# Patient Record
Sex: Female | Born: 1965 | Race: White | Hispanic: No | Marital: Married | Smoking: Never smoker
Health system: Southern US, Community
[De-identification: ages and names within clinical notes are randomized; demographics above are authoritative.]

## PROBLEM LIST (undated history)

## (undated) DIAGNOSIS — K5900 Constipation, unspecified: Secondary | ICD-10-CM

## (undated) DIAGNOSIS — N870 Mild cervical dysplasia: Secondary | ICD-10-CM

## (undated) DIAGNOSIS — L409 Psoriasis, unspecified: Secondary | ICD-10-CM

## (undated) DIAGNOSIS — Z9109 Other allergy status, other than to drugs and biological substances: Secondary | ICD-10-CM

## (undated) DIAGNOSIS — J45909 Unspecified asthma, uncomplicated: Secondary | ICD-10-CM

## (undated) HISTORY — DX: Mild cervical dysplasia: N87.0

## (undated) HISTORY — PX: MANDIBLE FRACTURE SURGERY: SHX706

## (undated) HISTORY — DX: Constipation, unspecified: K59.00

## (undated) HISTORY — DX: Unspecified asthma, uncomplicated: J45.909

## (undated) HISTORY — PX: LAPAROSCOPY: SHX197

## (undated) HISTORY — DX: Psoriasis, unspecified: L40.9

## (undated) HISTORY — PX: TUBAL LIGATION: SHX77

## (undated) HISTORY — DX: Other allergy status, other than to drugs and biological substances: Z91.09

---

## 2001-08-10 ENCOUNTER — Encounter: Payer: Self-pay | Admitting: Emergency Medicine

## 2001-08-10 ENCOUNTER — Emergency Department (HOSPITAL_COMMUNITY): Admission: EM | Admit: 2001-08-10 | Discharge: 2001-08-10 | Payer: Self-pay | Admitting: Emergency Medicine

## 2002-05-30 ENCOUNTER — Other Ambulatory Visit: Admission: RE | Admit: 2002-05-30 | Discharge: 2002-05-30 | Payer: Self-pay | Admitting: Obstetrics and Gynecology

## 2002-08-16 ENCOUNTER — Inpatient Hospital Stay (HOSPITAL_COMMUNITY): Admission: AD | Admit: 2002-08-16 | Discharge: 2002-08-16 | Payer: Self-pay | Admitting: Obstetrics and Gynecology

## 2002-09-25 DIAGNOSIS — O321XX0 Maternal care for breech presentation, not applicable or unspecified: Secondary | ICD-10-CM

## 2002-10-25 ENCOUNTER — Inpatient Hospital Stay (HOSPITAL_COMMUNITY): Admission: AD | Admit: 2002-10-25 | Discharge: 2002-10-25 | Payer: Self-pay | Admitting: *Deleted

## 2002-10-25 ENCOUNTER — Encounter: Payer: Self-pay | Admitting: *Deleted

## 2002-10-26 ENCOUNTER — Inpatient Hospital Stay (HOSPITAL_COMMUNITY): Admission: AD | Admit: 2002-10-26 | Discharge: 2002-10-26 | Payer: Self-pay | Admitting: Obstetrics & Gynecology

## 2002-11-24 ENCOUNTER — Inpatient Hospital Stay (HOSPITAL_COMMUNITY): Admission: AD | Admit: 2002-11-24 | Discharge: 2002-11-24 | Payer: Self-pay | Admitting: Obstetrics and Gynecology

## 2002-12-02 ENCOUNTER — Inpatient Hospital Stay (HOSPITAL_COMMUNITY): Admission: AD | Admit: 2002-12-02 | Discharge: 2002-12-05 | Payer: Self-pay | Admitting: Obstetrics and Gynecology

## 2002-12-02 ENCOUNTER — Encounter (INDEPENDENT_AMBULATORY_CARE_PROVIDER_SITE_OTHER): Payer: Self-pay

## 2003-01-08 ENCOUNTER — Other Ambulatory Visit: Admission: RE | Admit: 2003-01-08 | Discharge: 2003-01-08 | Payer: Self-pay | Admitting: Obstetrics and Gynecology

## 2004-02-23 ENCOUNTER — Other Ambulatory Visit: Admission: RE | Admit: 2004-02-23 | Discharge: 2004-02-23 | Payer: Self-pay | Admitting: Obstetrics and Gynecology

## 2005-09-12 ENCOUNTER — Ambulatory Visit: Payer: Self-pay | Admitting: Family Medicine

## 2006-08-02 ENCOUNTER — Inpatient Hospital Stay (HOSPITAL_COMMUNITY): Admission: AD | Admit: 2006-08-02 | Discharge: 2006-09-01 | Payer: Self-pay | Admitting: Obstetrics and Gynecology

## 2006-08-09 ENCOUNTER — Ambulatory Visit: Payer: Self-pay | Admitting: Neonatology

## 2006-08-10 ENCOUNTER — Encounter: Payer: Self-pay | Admitting: Obstetrics and Gynecology

## 2006-08-20 ENCOUNTER — Encounter: Payer: Self-pay | Admitting: Obstetrics and Gynecology

## 2006-08-29 ENCOUNTER — Encounter (INDEPENDENT_AMBULATORY_CARE_PROVIDER_SITE_OTHER): Payer: Self-pay | Admitting: Specialist

## 2008-02-11 ENCOUNTER — Ambulatory Visit: Payer: Self-pay | Admitting: Family Medicine

## 2008-02-11 DIAGNOSIS — M542 Cervicalgia: Secondary | ICD-10-CM | POA: Insufficient documentation

## 2008-02-11 DIAGNOSIS — J45909 Unspecified asthma, uncomplicated: Secondary | ICD-10-CM

## 2008-02-12 ENCOUNTER — Encounter: Payer: Self-pay | Admitting: Family Medicine

## 2008-02-15 ENCOUNTER — Emergency Department: Payer: Self-pay | Admitting: Emergency Medicine

## 2009-03-23 ENCOUNTER — Ambulatory Visit: Payer: Self-pay | Admitting: Family Medicine

## 2009-03-23 DIAGNOSIS — R42 Dizziness and giddiness: Secondary | ICD-10-CM

## 2009-08-12 ENCOUNTER — Ambulatory Visit: Payer: Self-pay | Admitting: Family Medicine

## 2009-08-12 DIAGNOSIS — L0231 Cutaneous abscess of buttock: Secondary | ICD-10-CM

## 2009-08-12 DIAGNOSIS — L03317 Cellulitis of buttock: Secondary | ICD-10-CM

## 2009-08-13 ENCOUNTER — Ambulatory Visit: Payer: Self-pay | Admitting: Family Medicine

## 2009-08-13 ENCOUNTER — Encounter (INDEPENDENT_AMBULATORY_CARE_PROVIDER_SITE_OTHER): Payer: Self-pay | Admitting: Internal Medicine

## 2009-08-18 ENCOUNTER — Ambulatory Visit: Payer: Self-pay | Admitting: Family Medicine

## 2009-08-21 ENCOUNTER — Ambulatory Visit: Payer: Self-pay | Admitting: Family Medicine

## 2009-08-24 ENCOUNTER — Telehealth (INDEPENDENT_AMBULATORY_CARE_PROVIDER_SITE_OTHER): Payer: Self-pay | Admitting: Internal Medicine

## 2009-09-18 ENCOUNTER — Telehealth (INDEPENDENT_AMBULATORY_CARE_PROVIDER_SITE_OTHER): Payer: Self-pay | Admitting: Internal Medicine

## 2009-09-23 ENCOUNTER — Telehealth (INDEPENDENT_AMBULATORY_CARE_PROVIDER_SITE_OTHER): Payer: Self-pay | Admitting: Internal Medicine

## 2010-06-17 ENCOUNTER — Ambulatory Visit: Payer: Self-pay | Admitting: Family Medicine

## 2010-06-17 DIAGNOSIS — R4589 Other symptoms and signs involving emotional state: Secondary | ICD-10-CM | POA: Insufficient documentation

## 2010-06-17 DIAGNOSIS — IMO0002 Reserved for concepts with insufficient information to code with codable children: Secondary | ICD-10-CM | POA: Insufficient documentation
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
## 2011-01-16 ENCOUNTER — Ambulatory Visit (INDEPENDENT_AMBULATORY_CARE_PROVIDER_SITE_OTHER): Payer: BC Managed Care – PPO | Admitting: Family Medicine

## 2011-01-16 ENCOUNTER — Encounter: Payer: Self-pay | Admitting: Family Medicine

## 2011-01-16 VITALS — BP 120/84 | HR 72 | Temp 98.8°F | Ht 66.75 in | Wt 169.2 lb

## 2011-01-16 DIAGNOSIS — J020 Streptococcal pharyngitis: Secondary | ICD-10-CM

## 2011-01-16 DIAGNOSIS — J029 Acute pharyngitis, unspecified: Secondary | ICD-10-CM

## 2011-01-16 DIAGNOSIS — R21 Rash and other nonspecific skin eruption: Secondary | ICD-10-CM

## 2011-01-16 LAB — POCT RAPID STREP A (OFFICE): Rapid Strep A Screen: NEGATIVE

## 2011-01-16 MED ORDER — TRIAMCINOLONE ACETONIDE 0.1 % EX CREA: TOPICAL_CREAM | Freq: Two times a day (BID) | CUTANEOUS | Status: DC

## 2011-01-16 MED ORDER — AMOXICILLIN 875 MG PO TABS: 875.0000 mg | ORAL_TABLET | Freq: Two times a day (BID) | ORAL | Status: DC

## 2011-01-16 MED ORDER — IBUPROFEN 600 MG PO TABS: 600.0000 mg | ORAL_TABLET | Freq: Four times a day (QID) | ORAL | Status: DC | PRN

## 2011-01-16 NOTE — Assessment & Plan Note (Signed)
underdosed PCN.  Pt prefers convenience of bid dosing so change to amoxicillin 875 x 7 days (complete 10 day course). Analgesic script provided.  Supportive care as per instructions.

## 2011-01-16 NOTE — Progress Notes (Signed)
  Subjective:    Patient ID: [name], female    DOB: Aug 08, 1966, 45 y.o.   MRN: [id number]  HPI CC: rash, ST  1. Friday started feeling awful; ST, fever, fatigue - went to [facility], RST +, given PCN 500mg  bid x 10 days.  Feeling some better but still fatigued, R face pain, ST.  Also doing warm salt water gargles.  Last strep was years ago.  Tends to get recurrences.  Drinking plenty of fluids, rest.  Stopped ibuprofen because ran out  2. Rash - started 1 wk ago.  Started anterior L ankle.  Now seems to be spreading.  Assumed it was poison oak, very pruritic.  Using baking soda.  Has been outside in last few weeks, no new lotions, detergents, soaps, shampoos, creams.  No sick contacts with similar rash.  Has been scratching.  No fevers since Friday.  No coughing, abd pain, myalgias, arthralgias.  Review of Systems Per HPI    Objective:   Physical Exam  Vitals reviewed. Constitutional: She is oriented to person, place, and time. She appears well-developed and well-nourished. No distress.  HENT:  Head: Normocephalic and atraumatic.  Right Ear: External ear normal.  Left Ear: External ear normal.  Nose: No mucosal edema or rhinorrhea. Right sinus exhibits no maxillary sinus tenderness and no frontal sinus tenderness. Left sinus exhibits no maxillary sinus tenderness and no frontal sinus tenderness.  Mouth/Throat: Uvula is midline and mucous membranes are normal. Oropharyngeal exudate, posterior oropharyngeal edema and posterior oropharyngeal erythema present. No tonsillar abscesses.       Tonsils with exudates and swollen bilaterally  Eyes: Conjunctivae and EOM are normal. Pupils are equal, round, and reactive to light. No scleral icterus.  Neck: Normal range of motion. Neck supple. No JVD present. No thyromegaly present.  Cardiovascular: Normal rate, regular rhythm, normal heart sounds and intact distal pulses.   No murmur heard. Pulmonary/Chest: Effort normal and breath sounds  normal. No respiratory distress. She has no wheezes. She has no rales.  Abdominal: She exhibits no distension and no mass. There is no hepatosplenomegaly. There is no tenderness. There is no rebound and no guarding.  Lymphadenopathy:    She has no cervical adenopathy.  Neurological: She is alert and oriented to person, place, and time.  Skin: Skin is warm and dry. Rash noted.       L anterior ankle rash with lichenification/scaling. R lower abd with raised macular erythematous pruritic rash R posterior calf with linear erythematous macule R index finger with pruritic papule at DIP. Spares palms/soles, wrists, interdigital spaces No ticks noted  Psychiatric: She has a normal mood and affect.          Assessment & Plan:

## 2011-01-16 NOTE — Assessment & Plan Note (Addendum)
Don't think related to pharyngitis as started prior with exposure outside. ? Poison oak/ivy with scratch leading to lichenification reaction anterior ankle, not yet other areas of rash. Treat itch with antifungal, recommended pt use benadryl at night to help combat itch. If not improving, return for further eval.

## 2011-01-16 NOTE — Patient Instructions (Signed)
Stop penicillin Start amoxicillin 875mg  twice daily for 7 days. For skin rash - use triamcinolone cream twice daily for 2-3 weeks. Ibuprofen sent to pharmacy as well - take with food.  Push fluids and plenty of water. Suck on cold things like popsicles or warm things like herbal teas (whichever soothes the throat better). If not improving as expected, return to be seen.  Call us with questions.

## 2011-02-10 NOTE — Discharge Summary (Signed)
[name], [name]          ACCOUNT NO.:  [id number]   MEDICAL RECORD NO.:  [id number]          PATIENT TYPE:  INP   LOCATION:  9137                          FACILITY:  [facility]   PHYSICIAN:  Lenoard Aden, M.D.DATE OF BIRTH:  04-Jul-1966   DATE OF ADMISSION:  08/02/2006  DATE OF DISCHARGE:  09/01/2006                               DISCHARGE SUMMARY   Patient underwent admission for symptomatic placenta previa.   HOSPITAL COURSE:  Uncomplicated.   EXPECTANT MANAGEMENT:  Patient had a bleed which became management on  August 29, 2006, underwent repeat C. section and tubal ligation  without complications.  Discharged to home day #3.  Discharge teaching  done.  Prenatal vitamins. Ibuprofen given.  Follow-up in the office in 4-  6 weeks.      Lenoard Aden, M.D.  Electronically Signed     RJT/MEDQ  D:  10/05/2006  T:  10/05/2006  Job:  [id number]

## 2011-02-10 NOTE — Discharge Summary (Signed)
   NAME:  [name], [name]                      ACCOUNT NO.:  [id number]   MEDICAL RECORD NO.:  [id number]                   PATIENT TYPE:   LOCATION:                                       FACILITY:   PHYSICIAN:  Maxie Better, M.D.            DATE OF BIRTH:   DATE OF ADMISSION:  DATE OF DISCHARGE:                                 DISCHARGE SUMMARY   ADMISSION DIAGNOSES:  1. Breech presentation.  2. Spontaneous rupture of membranes.  3. Early labor.  4. Intrauterine gestation at 12 and 1/7th weeks.   DISCHARGE DIAGNOSES:  1. Intrauterine gestation at 28 and 1/7th weeks, delivered.  2. Homero Fellers breech presentation.  3. Early labor.  4. Spontaneous rupture of membranes.  5. Postoperative anemia.   PROCEDURE:  Primary Cesarean section.   HISTORY OF PRESENT ILLNESS:  The patient is a 45 year old Gravida III, Para  I, 0/1/1 female at 37 and 1/7th weeks gestation who presented with  spontaneous rupture of membranes with a breech presentation. She was  admitted due to the above findings and need for a Cesarean section.   HOSPITAL COURSE:  The patient was admitted. Rupture of membranes was  confirmed. She was taken to the OR where she underwent a primary Cesarean  section with resultant delivery of an 8 pound 10 ounce female infant with  Apgar's of 8 and 9. Normal tubes and ovaries. The patient had an  uncomplicated postoperative course. On postoperative day three the patient  was tolerating a regular diet. Incision showed no evidence of an infection.  She was deemed able to be discharged to home.   LABORATORY DATA:  Her CBC on postoperative day one showed a hemoglobin and  hematocrit of 9.3 and 26. White count of 9.5. Platelet count 166,000.   DISPOSITION:  To home.   CONDITION ON DISCHARGE:  Stable.   DISCHARGE MEDICATIONS:  1. Prenatal vitamins one by mouth each day.  2. Ferrous sulfate 325 mg one by mouth each day.  3. Tylox #30 one every six to eight hours by mouth as  needed pain.  4. Motrin 600 mg one every six hours as needed pain.    FOLLOW UP:  At four weeks at [facility]. Discharge instructions were  reviewed. Postpartum booklet given with instructions.                                               Maxie Better, M.D.    [place]/MEDQ  D:  12/26/2002  T:  12/27/2002  Job:  [id number]

## 2011-02-10 NOTE — Op Note (Signed)
[name], [name]          ACCOUNT NO.:  [id number]   MEDICAL RECORD NO.:  [id number]          PATIENT TYPE:  INP   LOCATION:                                [facility]ILITY[facility]  [facility]   PHYSICIAN:  Lenoard Aden, M.D.DATE OF BIRTH:  [birth year]   DATE OF PROCEDURE:  08/29/2006  DATE OF DISCHARGE:                               OPERATIVE REPORT   PREOPERATIVE DIAGNOSES:  1. Thirty-four-week intrauterine pregnancy.  2. Placenta previa with acute onset of bleeding.  3. Footling breech presentation.  4. Desire for elective sterilization.   POSTOPERATIVE DIAGNOSES:  1. Thirty-four-week intrauterine pregnancy.  2. Placenta previa with acute onset of bleeding.  3. Footling breech presentation.  4. Desire for elective sterilization.   PROCEDURE:  1. Repeat cesarean section.  2. Tubal ligation.   SURGEON:  Lenoard Aden, M.D.   ASSISTANT:  Chester Holstein. Earlene Plater, M.D.   ANESTHESIA:  Spinal.   ANESTHESIOLOGIST:  Quillian Quince, M.D.   ESTIMATED BLOOD LOSS:  1000 mL.   COMPLICATIONS:  None.   DRAINS:  Foley.   COUNTS:  Correct.   DISPOSITION:  Patient to Recovery in good condition.   FINDINGS:  Full-term living female in footling breech position, 5 pounds 7  ounces, Apgars 8 and 9.  Posterior placenta previa, no accreta.  Normal  tubes.  Normal ovaries.   SPECIMENS:  Tubal segments to Pathology.   BRIEF OPERATIVE NOTE:  After being apprised of the risks of anesthesia,  infection, bleeding, injury to abdominal organs with need for repair,  delayed versus immediate complications to include bowel and bladder  injury, failure of risk of tubal ligation of 5-10 per thousand, the  patient is brought to the operating room, where she is administered a  spinal anesthetic without complication, prepped and draped in the usual  sterile fashion, Foley catheter placed.  After achieving adequate  anesthesia and dilute Marcaine solution placed, Pfannenstiel skin  incision is made with a  scalpel and carried down to fascia, which is  nicked in the midline and opened transversely using Mayo scissors.  The  rectus muscle is dissected sharply in the midline and peritoneum entered  sharply and bladder blade placed.  Visceroperitoneum is scored sharply  off the lower uterine segment and Kerr hysterotomy incision made.  Delivery from a footling breech position in the usual maneuvers is made  and flexion of the fetal vertex after clear amniotomy of fluid is noted,  Apgars 8 and 9, Pediatrics in attendance.  Placenta is delivered from a  posterior placenta previa location, manually intact, 3-vessel cord  noted.  Uterus exteriorized, curetted using a dry lap pack and closed in  2 running imbricating layers of a 0 chromic suture.  Please note at the  time, cervix was 3-cm dilated.  Good hemostasis was achieved, bladder  flap inspected and found to be hemostatic.  Right tube is traced out to  the fimbriated end.  Ampullary-isthmic portion of the tube is  identified.  Vascular portion in the mesosalpinx is cauterized, creating  a window; 0 plain ties are placed distally and proximally, tubal segment  excised.  Tubal  lumens are visualized and cauterized.  The same  procedure is done on the right tube as on the left tube.  Tubal lumens  are visualized, good hemostasis accomplished.  Irrigation is  accomplished.  Inspection is reestablished.  Inspection of the incision  is established and multiple interrupted sutures are placed to obtain  good hemostasis, which is obtained, prior to moving to the fascia, which  is then closed with a 0 Vicryl in a continuous-running fashion, and skin  closed using staples.  The patient tolerates the procedure well and is  transferred to Recovery in good condition.      Lenoard Aden, M.D.  Electronically Signed     RJT/MEDQ  D:  08/29/2006  T:  08/29/2006  Job:  [id number]

## 2011-02-10 NOTE — Op Note (Signed)
NAME:  [name]                    ACCOUNT NO.:  [id number]   MEDICAL RECORD NO.:  [id number]                   PATIENT TYPE:  INP   LOCATION:  9105                                 [facility]ACILITY:  [facility]   PHYSICIAN:  Maxie Better, M.D.            DATE OF BIRTH:  10/03/1965   DATE OF PROCEDURE:  12/02/2002  DATE OF DISCHARGE:                                 OPERATIVE REPORT   PREOPERATIVE DIAGNOSIS:  1. Breech presentation.  2. Early labor.  3. Spontaneous rupture of membranes.  4. Intrauterine gestation at 36-1/7 weeks.   POSTOPERATIVE DIAGNOSES:  1. Frank breech.  2. Early labor.  3. Spontaneous rupture of membranes.  4. Intrauterine gestation at 36-1/7 weeks.   PROCEDURE:  Primary cesarean section, low transverse uterine incision.   SURGEON:  Maxie Better, M.D.   ANESTHESIA:  Spinal.   FINDINGS:  Live female, frank breech position lying on abdomen.  Normal tubes  and ovaries.  Weight 8 pounds 10 ounces, Apgars of 8 and 9.   INDICATIONS:  [age]-year-old gravida 3, para 1-0-1-1 female at 36-1/7  weeks' gestation with spontaneous rupture of membranes in early labor who  was found to be breech presentation and who is now being admitted for  primary cesarean section.  Risks and benefits of the procedure have been  explained to the patient and her husband.  Consent was signed, and the  patient was transferred to the operating room. Her group B status was  unknown.   DESCRIPTION OF PROCEDURE:  Under adequate spinal anesthesia, the patient was  placed in the supine position with a left lateral tilt.  She was sterilely  prepped and draped in the usual fashion with an indwelling Foley catheter  placed sterilely.  Then 0.25% Marcaine was injected along the planned  incision line.  A Pfannenstiel skin incision was then made, carried down to  the rectus fascia was then made, carried down to the rectus fascia using  Bovie cautery.  The rectus fascia was  incised in the midline and extended  transversely.  The rectus fascia was bluntly entered with cautery,  dissected off the rectus muscle in a superior and inferior fashion.  The  rectus muscle was split in the midline.  The parietal peritoneum was entered  bluntly and extended.  The vesicouterine peritoneum was then opened.  The  bladder was then bluntly dissected off of the lower uterine segment and  displaced inferiorly using a bladder retractor.  A curvilinear low  transverse uterine  incision was then carefully made and extended  bilaterally using bandage scissors.  Subsequent delivery of a live female  infant was accomplished from a breech frank position.  The baby was lying on  the abdomen.  He was delivered in the usual breech maneuver.  The cord was  clamped and cut, and the baby was bulb suctioned on the abdomen, and he was  transferred to the awaiting pediatricians who assigned Apgars of  8 and 9 at  one and five minutes.  The weight of the baby was 8 pounds 10 ounces.  Normal tubes and ovaries were identified.  No intracavitary lesions were  seen.  The uterine incision was closed in two layers.  The first layer was a  0 Monocryl running locked stitch.  The second layer was imbricated using 0  Monocryl.  On the right mid area of the uterine incision multiple figure-of-  eight sutures were then made for good hemostasis.  Small bleeding along the  inferior aspect of the uterus as well as the bladder resection were  cauterized.  With good hemostasis noted, the abdomen was copiously irrigated  and suctioned of debris.  The parietal peritoneum and the vesicouterine  peritoneum were now closed.  The undersurface of the rectus fascia was  inspected.  The rectus muscles were inspected.  Small bleeders were  cauterized.  The rectus fascia was closed with 0 Vicryl x2.  The  subcutaneous area was irrigated and suctioned of debris, and the skin was  approximated using Ethibond staples.   Specimens:  Placenta sent to  pathology.  Estimated blood loss was 800 mL.  Intraoperative fluid was 2300  mL crystalloid.  Urine output was 375 mL clear yellow urine.  Complications  were none.  Sponge, instrument counts x2 were correct.  The patient  tolerated the procedure well and was transferred to the recovery room in  stable condition.                                               Maxie Better, M.D.    /MEDQ  D:  12/02/2002  T:  12/02/2002  Job:  [id number]

## 2011-02-10 NOTE — Consult Note (Signed)
   NAME:  [name], [name]                    ACCOUNT NO.:  [id number]   MEDICAL RECORD NO.:  [id number]                   PATIENT TYPE:  MAT   LOCATION:  [facility]                                 [facility]ILITY:  [facility]   PHYSICIAN:  Lenoard Aden, M.D.             DATE OF BIRTH:  [birth year]   DATE OF CONSULTATION:  11/24/2002  DATE OF DISCHARGE:                                   CONSULTATION   CHIEF COMPLAINT:  Rule out preterm labor.   HISTORY OF PRESENT ILLNESS:  A 45 year old white female G3, P1, EDD December 29, 2002 at 35 weeks to rule out preterm labor.  History of contractions.  History of preterm cervical change.   ALLERGIES:  The patient has no known drug allergies.   MEDICATIONS:  Prenatal vitamins.   PAST MEDICAL HISTORY:  History of TAB in 1998, spontaneous vaginal delivery  in 1994.  History of laparoscopy for infertility.  No other medical or  surgical hospitalizations except for jaw surgery.   FAMILY HISTORY:  Thyroid disease, uterine cancer, stroke, cardiovascular  disease.   PRENATAL LABORATORY DATA:  Blood type O- status post RhoGAM.  Rh antibody  negative.  Rubella immune.  Hepatitis B surface negative.  HIV nonreactive.   Prenatal course complicated by preterm cervical change, stable on bed rest.   PHYSICAL EXAMINATION:  GENERAL:  She is a well-developed, well-nourished  white female in no apparent distress.  HEENT:  Normal.  LUNGS:  Clear.  HEART:  Regular rhythm.  ABDOMEN:  Soft, gravid, nontender.  Estimated fetal weight on ultrasound 6  pounds.  PELVIC:  Cervix is loose 2 cm, thick, breech, -2.  EXTREMITIES:  No cords.  NEUROLOGIC:  Nonfocal.   LABORATORIES:  Fetal heart rate reactive.  Contractions are irregular.   IMPRESSION:  1. A 35-week OB.  2. Breech malpresentation.  3. Preterm cervical change, stable.    PLAN:  Subcutaneous terbutaline x1.  Procardia x24 hours p.r.n.  contractions.  Risks, benefits discussed.  External cephalic version  discussed.  Follow up in the office in three days.  Work limitations  discussed.                                               Lenoard Aden, M.D.    RJT/MEDQ  D:  11/24/2002  T:  11/24/2002  Job:  [id number]

## 2011-02-10 NOTE — H&P (Signed)
NAMESALLYE, LUNZ          ACCOUNT NO.:  [id number]   MEDICAL RECORD NO.:  [id number]          PATIENT TYPE:  INP   LOCATION:  9152                          FACILITY:  [facility]   PHYSICIAN:  Lenoard Aden, M.D.DATE OF BIRTH:  1965-10-04   DATE OF ADMISSION:  08/02/2006  DATE OF DISCHARGE:                                HISTORY & PHYSICAL   CHIEF COMPLAINT:  Bleeding.   She is a 45 year old Caucasian female, G6, P1, 1-3-2, who presents at [redacted]  weeks gestation with a known posterior complete placenta previa and heavy  bleeding this morning, starting at 8:00 a.m.  She denies any cramping.  She  denies any leakage of fluids.  She reports good fetal movement.  She has no  known drug allergies.   She has a history of an abnormal Pap smear, history of surgical and  hospitalization for C-section in 2004, pyelonephritis in 1996.  She is a  nonsmoker and a nondrinker.  She denies domestic physical violence.   Medications include prenatal vitamins.   FAMILY HISTORY:  Diabetes, myocardial infarction, chronic hypertension,  uterine cancer, thyroid dysfunction, mental retardation, lymphoma, and  stroke.   Her pregnancy history is remarkable for two miscarriages, one abortion, all  first trimester in nature.  She had a first pregnancy which she has two  living children, one was 8 pounds, 9 ounces, vaginal delivery in 1994, and  one was an emergency C-section in 2004.   PHYSICAL EXAMINATION:  GENERAL:  She is a well-developed and well-nourished  white female in no apparent distress.  VITAL SIGNS:  Blood pressure 130/80.  HEENT:  Normal.  LUNGS:  Clear.  HEART:  Regular rhythm.  ABDOMEN:  Soft, gravid, and nontender.  PELVIC:  Speculum exam reveals about 10 cc of clotted blood in the posterior  fornix.  Cervix is visually closed.  No active bleeding is noted.  EXTREMITIES:  No cords.  NEUROLOGIC:  Nonfocal.   Coagulation studies within normal limits.  Hemoglobin of 13, platelet  count  229,000.  NST is reactive with fetal heart tones in the 130-140 beats per  minute range.  Accelerations.  No contractions.  No decelerations.   IMPRESSION:  1. A 30-week intrauterine pregnancy.  2. Previous cesarean sections, for repeat.  3. Complete posterior placenta previa with bleeding.  4. Rh-negative, status post RhoGAM on July 10, 2006.   PLAN:  Admit.  Hold clot.  Fetal monitoring today.  Bedrest and bathroom  privileges.  __________ ultrasound.  Anticipate repeat C-section potentially  in conjunction with amniocentesis between 34-36 weeks.      Lenoard Aden, M.D.  Electronically Signed     RJT/MEDQ  D:  08/02/2006  T:  08/02/2006  Job:  [id number]

## 2011-12-19 ENCOUNTER — Ambulatory Visit (INDEPENDENT_AMBULATORY_CARE_PROVIDER_SITE_OTHER): Payer: BC Managed Care – PPO | Admitting: Family Medicine

## 2011-12-19 ENCOUNTER — Encounter: Payer: Self-pay | Admitting: Family Medicine

## 2011-12-19 VITALS — BP 132/74 | HR 74 | Temp 98.1°F | Wt 176.0 lb

## 2011-12-19 DIAGNOSIS — J029 Acute pharyngitis, unspecified: Secondary | ICD-10-CM

## 2011-12-19 DIAGNOSIS — J020 Streptococcal pharyngitis: Secondary | ICD-10-CM

## 2011-12-19 MED ORDER — AMOXICILLIN 875 MG PO TABS: 875.0000 mg | ORAL_TABLET | Freq: Two times a day (BID) | ORAL | Status: AC

## 2011-12-19 NOTE — Progress Notes (Signed)
duration of symptoms: 4 days Rhinorrhea: no congestion:yes ear pain:no sore throat: no Cough: no myalgias:yes other concerns:chills and fatigued, facial pain and pressure, eyes watering, no fevers known She's some better today, ie no fever and some less pain.   Not using nasal saline- doesn't tolerate that.   Using vicks.    Advice worker.  Mult sick exposures.  ROS: See HPI.  Otherwise negative.    Meds, vitals, and allergies reviewed.   GEN: nad, alert and oriented HEENT: mucous membranes moist, TM w/o erythema, nasal epithelium injected with clear rhinorrhea, OP with cobblestoning NECK: supple w/o LA, tender LA on L side CV: rrr. PULM: ctab, no inc wob EXT: no edema  RST positive.

## 2011-12-19 NOTE — Patient Instructions (Signed)
Start the antibiotics today and gt some rest.  Drink plenty of fluids and take ibuprofen as needed.

## 2011-12-20 NOTE — Assessment & Plan Note (Signed)
With LA and RST positive.  Start abx, supportive tx o/w and fu prn.  She agrees. Nontoxic.

## 2012-04-25 ENCOUNTER — Encounter: Payer: Self-pay | Admitting: Family Medicine

## 2012-04-25 ENCOUNTER — Ambulatory Visit (INDEPENDENT_AMBULATORY_CARE_PROVIDER_SITE_OTHER): Payer: BC Managed Care – PPO | Admitting: Family Medicine

## 2012-04-25 VITALS — BP 120/88 | HR 87 | Temp 98.4°F | Wt 179.0 lb

## 2012-04-25 DIAGNOSIS — H6120 Impacted cerumen, unspecified ear: Secondary | ICD-10-CM

## 2012-04-25 NOTE — Progress Notes (Signed)
R ear- difficult to hear.  For 1 week.  No L sided sx.  No FCNAVD.  R ear feels plugged but not painful.  She used OTC de-wax kit.  No rhinorrhea.   Meds, vitals, and allergies reviewed.   ROS: See HPI.  Otherwise, noncontributory.  nad ncat R cerumen impaction resolved with irrigation and recheck TM wnl.  L tm, nasal and OP exam wnl

## 2012-04-25 NOTE — Patient Instructions (Signed)
Take care!

## 2012-04-26 DIAGNOSIS — H6120 Impacted cerumen, unspecified ear: Secondary | ICD-10-CM | POA: Insufficient documentation

## 2012-04-26 NOTE — Assessment & Plan Note (Signed)
Resolved, feels better, hearing at baseline.  F/u prn.

## 2013-02-25 ENCOUNTER — Ambulatory Visit (INDEPENDENT_AMBULATORY_CARE_PROVIDER_SITE_OTHER): Payer: BC Managed Care – PPO | Admitting: Family Medicine

## 2013-02-25 ENCOUNTER — Encounter: Payer: Self-pay | Admitting: Family Medicine

## 2013-02-25 VITALS — BP 122/70 | HR 69 | Temp 97.6°F | Wt 168.8 lb

## 2013-02-25 DIAGNOSIS — N390 Urinary tract infection, site not specified: Secondary | ICD-10-CM

## 2013-02-25 DIAGNOSIS — R300 Dysuria: Secondary | ICD-10-CM

## 2013-02-25 LAB — POCT URINALYSIS DIPSTICK
Protein, UA: NEGATIVE
Urobilinogen, UA: NEGATIVE

## 2013-02-25 MED ORDER — CIPROFLOXACIN HCL 250 MG PO TABS: 250.0000 mg | ORAL_TABLET | Freq: Two times a day (BID) | ORAL | Status: DC

## 2013-02-25 NOTE — Patient Instructions (Addendum)
Drink plenty of water and start the antibiotics today.  We'll contact you with your lab report.  Take care.   

## 2013-02-25 NOTE — Progress Notes (Signed)
Dysuria:yes, pressure with and without urination duration of symptoms: since last night abdominal pain: yes, lower abd pain fevers:no back pain:no vomiting:no Menses started yesterday  Meds, vitals, and allergies reviewed.   ROS: See HPI.  Otherwise negative.    GEN: nad, alert and oriented HEENT: mucous membranes moist NECK: supple CV: rrr.  PULM: ctab, no inc wob ABD: soft, +bs, suprapubic area tender EXT: no edema SKIN: no acute rash BACK: no CVA pain

## 2013-02-26 DIAGNOSIS — N390 Urinary tract infection, site not specified: Secondary | ICD-10-CM | POA: Insufficient documentation

## 2013-02-26 NOTE — Assessment & Plan Note (Signed)
Nontoxic, cipro, ucx, fluids, f/u prn.  She agrees. D/w pt.

## 2013-02-27 ENCOUNTER — Telehealth: Payer: Self-pay

## 2013-02-27 NOTE — Telephone Encounter (Signed)
pt left v/m; Pt was seen 02/25/13 and given Cipro for UTI; pt woke up during the night with heart racing and numbness in fingers and toes and was not sure if possible reaction to Cipro. Pt did not take med this morning. 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 Today pt states she feels OK; no heart racing.Please advise.

## 2013-02-27 NOTE — Telephone Encounter (Signed)
This would be atypical for a med reaction.  Especially if she had had this happen prev, ie before the cipro, I would think it was unrelated and okay to continue the abx.  I would restart the abx tonight.  If she has another episode (even if she isn't on the medicine), then we need to recheck her.  Thanks.

## 2013-02-27 NOTE — Telephone Encounter (Signed)
Patient advised.

## 2013-02-28 LAB — URINE CULTURE

## 2013-10-01 ENCOUNTER — Ambulatory Visit: Payer: Self-pay | Admitting: Obstetrics and Gynecology

## 2013-12-15 ENCOUNTER — Ambulatory Visit (INDEPENDENT_AMBULATORY_CARE_PROVIDER_SITE_OTHER): Payer: BC Managed Care – PPO | Admitting: Family Medicine

## 2013-12-15 ENCOUNTER — Encounter: Payer: Self-pay | Admitting: Family Medicine

## 2013-12-15 VITALS — BP 126/82 | HR 82 | Temp 98.0°F | Wt 178.8 lb

## 2013-12-15 DIAGNOSIS — J45909 Unspecified asthma, uncomplicated: Secondary | ICD-10-CM

## 2013-12-15 MED ORDER — LORATADINE 10 MG PO TABS: 10.0000 mg | ORAL_TABLET | Freq: Every day | ORAL | Status: DC

## 2013-12-15 MED ORDER — ALBUTEROL SULFATE HFA 108 (90 BASE) MCG/ACT IN AERS: 1.0000 | INHALATION_SPRAY | Freq: Four times a day (QID) | RESPIRATORY_TRACT | Status: DC | PRN

## 2013-12-15 NOTE — Patient Instructions (Signed)
Check your air filters and try claritin 10mg  a day.  If you need the inhaler more than a few times a week, let me know. We'll likely need to add on another inhaler. Take care.

## 2013-12-15 NOTE — Progress Notes (Signed)
Pre visit review using our clinic review tool, if applicable. No additional management support is needed unless otherwise documented below in the visit note.  She has a dog exposure and allergy related to that.  Asthma wise she was doing well until the exposure.  Had used some SABA with relief, but some jitteriness.  Some wheeze, dec with SABA.  Using it a few times in the last few weeks.  She can get SOB w/o the medicine.  Some nighttime cough, dec with inhaler.  Not taking claritin or similar. Minimal rhinorrhea, some sneezing and itchy eyes.    Meds, vitals, and allergies reviewed.   ROS: See HPI.  Otherwise, noncontributory.  GEN: nad, alert and oriented HEENT: mucous membranes moist, tm wnl, nasal and OP exam wnl NECK: supple w/o LA CV: rrr.   PULM: ctab, no inc wob ABD: soft, +bs EXT: no edema SKIN: no acute rash

## 2013-12-16 NOTE — Assessment & Plan Note (Addendum)
With pet exposure.  Will check air filters.  Use SABA prn, add on claritin.  Call back as needed, she may need inhaled steroid.  D/w pt.  She agrees.  Nontoxic.  We could refer for allergy shots if needed.

## 2014-01-30 ENCOUNTER — Ambulatory Visit: Payer: BC Managed Care – PPO | Admitting: Family Medicine

## 2014-03-11 ENCOUNTER — Telehealth: Payer: Self-pay | Admitting: Family Medicine

## 2014-03-11 DIAGNOSIS — J45909 Unspecified asthma, uncomplicated: Secondary | ICD-10-CM

## 2014-03-11 NOTE — Telephone Encounter (Signed)
Patient notified as instructed by telephone. Patient aware that the referral coordinator will be in touch with her to get this scheduled.

## 2014-03-11 NOTE — Telephone Encounter (Signed)
Pt called and request referral to [facility]. PCP is Dr. Glori Bickers but pt explained she has seen more of you in the past about allergies. Please advise  Call back # [phone]

## 2014-03-11 NOTE — Telephone Encounter (Signed)
Ordered. Thanks

## 2014-06-11 ENCOUNTER — Encounter: Payer: Self-pay | Admitting: Family Medicine

## 2014-06-11 ENCOUNTER — Ambulatory Visit (INDEPENDENT_AMBULATORY_CARE_PROVIDER_SITE_OTHER): Payer: BC Managed Care – PPO | Admitting: Family Medicine

## 2014-06-11 VITALS — BP 118/80 | HR 70 | Temp 98.7°F | Wt 183.5 lb

## 2014-06-11 DIAGNOSIS — M542 Cervicalgia: Secondary | ICD-10-CM

## 2014-06-11 MED ORDER — CYCLOBENZAPRINE HCL 10 MG PO TABS: 5.0000 mg | ORAL_TABLET | Freq: Every evening | ORAL | Status: DC | PRN
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
## 2014-06-11 NOTE — Patient Instructions (Signed)
Ibuprofen 200/tab- take up to 3 tabs up to 3 times a day with food.   Flexeril at night if needed.   Ice and heat in the meantime. Massage may help.  Take care.  Glad to see you.

## 2014-06-12 DIAGNOSIS — M542 Cervicalgia: Secondary | ICD-10-CM | POA: Insufficient documentation
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
## 2014-12-07 ENCOUNTER — Telehealth: Payer: Self-pay | Admitting: Family Medicine

## 2014-12-07 ENCOUNTER — Encounter: Payer: Self-pay | Admitting: Internal Medicine

## 2014-12-07 ENCOUNTER — Ambulatory Visit (INDEPENDENT_AMBULATORY_CARE_PROVIDER_SITE_OTHER): Payer: BC Managed Care – PPO | Admitting: Internal Medicine

## 2014-12-07 VITALS — BP 116/84 | HR 80 | Temp 98.1°F | Wt 187.0 lb

## 2014-12-07 DIAGNOSIS — R0789 Other chest pain: Secondary | ICD-10-CM

## 2014-12-07 DIAGNOSIS — R0602 Shortness of breath: Secondary | ICD-10-CM

## 2014-12-07 NOTE — Telephone Encounter (Signed)
[facility] Patient Name: [name] DOB: October 15, 1965 Initial Comment caller is c/o shortness of breath and chest pain Nurse Assessment Nurse: Ronnald Ramp, RN, Miranda Date/Time (Eastern Time): 12/07/2014 8:23:21 AM Confirm and document reason for call. If symptomatic, describe symptoms. ---Caller states she is having pressure, heaviness, soreness in her chest. Also with trouble breathing. Symptoms started on Friday. Has the patient traveled out of the country within the last 30 days? ---Not Applicable Does the patient require triage? ---Yes Related visit to physician within the last 2 weeks? ---No Does the PT have any chronic conditions? (i.e. diabetes, asthma, etc.) ---Yes List chronic conditions. ---Asthma Did the patient indicate they were pregnant? ---No Guidelines Guideline Title Affirmed Question Affirmed Notes Chest Pain [1] Chest pain lasts > 5 minutes AND [2] described as crushing, pressure-like, or heavy Final Disposition User Call EMS 911 Now Jones, RN, Miranda Comments Caller states she already has an appt for 10 am. Triaged to call 911 but she refused because she already set up the appt and wants to wait.

## 2014-12-07 NOTE — Patient Instructions (Signed)

## 2014-12-07 NOTE — Telephone Encounter (Signed)
PLEASE NOTE: All timestamps contained within this report are represented as [place] Standard Time. CONFIDENTIALTY NOTICE: This fax transmission is intended only for the addressee. It contains information that is legally privileged, confidential or otherwise protected from use or disclosure. If you are not the intended recipient, you are strictly prohibited from reviewing, disclosing, copying using or disseminating any of this information or taking any action in reliance on or regarding this information. If you have received this fax in error, please notify us immediately by telephone so that we can arrange for its return to [place]. Phone: [phone], Toll-Free: [phone], Fax: [phone] Page: 1 of 2 Call Id: [id number] [facility] Patient Name: [facility] Gender: Female DOB: 12-07-1965 Age: 49 Y 97 M 25 D Return Phone Number: [phone] (Primary) Address: [address] [place] [address] Client  [facility] Client Client Site [place] - Day Physician [name] Contact Type Call Call Type Triage / Clinical Relationship To Patient Self Appointment Disposition EMR Patient Reports Appointment Already Scheduled Return Phone Number [phone] (Primary) Chief Complaint CHEST PAIN (>=21 years) - pain, pressure, heaviness or tightness Initial Comment caller is c/o shortness of breath and chest pain PreDisposition Call Doctor Info pasted into Epic Yes Nurse Assessment Nurse: Ronnald Ramp, RN, Miranda Date/Time (Eastern Time): 12/07/2014 8:23:21 AM Confirm and document reason for call. If symptomatic, describe symptoms. ---Caller states she is having pressure, heaviness, soreness in her chest. Also with trouble breathing. Symptoms started on Friday. Has the patient traveled out of the country within the last  30 days? ---Not Applicable Does the patient require triage? ---Yes Related visit to physician within the last 2 weeks? ---No Does the PT have any chronic conditions? (i.e. diabetes, asthma, etc.) ---Yes List chronic conditions. ---Asthma Did the patient indicate they were pregnant? ---No Guidelines Guideline Title Affirmed Question Affirmed Notes Nurse Date/Time (Eastern Time) Chest Pain [1] Chest pain lasts > 5 minutes AND [2] described as crushing, pressure-like, or heavy Ronnald Ramp, RN, Miranda 12/07/2014 8:25:35 AM Disp. Time Eilene Ghazi Time) Disposition Final User 12/07/2014 8:22:25 AM Send to Urgent Queue Byrd Hesselbach 12/07/2014 8:28:15 AM Call EMS 911 Now Yes Ronnald Ramp, RN, Miranda PLEASE NOTE: All timestamps contained within this report are represented as [place] Standard Time. CONFIDENTIALTY NOTICE: This fax transmission is intended only for the addressee. It contains information that is legally privileged, confidential or otherwise protected from use or disclosure. If you are not the intended recipient, you are strictly prohibited from reviewing, disclosing, copying using or disseminating any of this information or taking any action in reliance on or regarding this information. If you have received this fax in error, please notify us immediately by telephone so that we can arrange for its return to [place]. Phone: [phone], Toll-Free: [phone], Fax: [phone] Page: 2 of 2 Call Id: [id number] Caller Understands: Yes Disagree/Comply: Disagree Disagree/Comply Reason: Disagree with instructions Care Advice Given Per Guideline CALL EMS 911 NOW: Immediate medical attention is needed. You need to hang up and call 911 (or an ambulance). Psychologist, forensic Discretion: I'll call you back in a few minutes to be sure you were able to reach them.) CARE ADVICE given per Chest Pain (Adult) guideline. After Care Instructions Given Call Event Type User Date / Time Description Comments User: Leverne Humbles, RN Date/Time Eilene Ghazi Time): 12/07/2014 8:31:34 AM Caller states she already has an appt for 10 am. Triaged to call  911 but she refused because she already set up the appt and wants to wait.

## 2014-12-07 NOTE — Progress Notes (Signed)
Pre visit review using our clinic review tool, if applicable. No additional management support is needed unless otherwise documented below in the visit note. 

## 2014-12-07 NOTE — Progress Notes (Signed)
Subjective:    Patient ID: [name], female    DOB: 02/18/1966, 49 y.o.   MRN: [id number]  HPI  Pt presents to the clinic today with c/o chest pressure and shortness of breath. She reports this started 3 days ago. It is constant but seems worse at night. She denies runny nose, cough or reflux. She has not had any trauma to the chest wall. Nothing seems to make it worse. She does have a history of asthma. She has been using her albuterol inhaler with some relief. She reports she has had these symptoms before but the chest pressure is more severe than it has ever been.  Review of Systems      History reviewed. No pertinent past medical history.  Current Outpatient Prescriptions  Medication Sig Dispense Refill  . albuterol (PROVENTIL HFA;VENTOLIN HFA) 108 (90 BASE) MCG/ACT inhaler Inhale 1-2 puffs into the lungs every 6 (six) hours as needed for wheezing or shortness of breath. 1 Inhaler 2  . montelukast (SINGULAIR) 10 MG tablet Take 10 mg by mouth at bedtime.    . fluticasone (FLONASE) 50 MCG/ACT nasal spray   3   No current facility-administered medications for this visit.    Allergies  Allergen Reactions  . Sulfamethoxazole-Trimethoprim     REACTION: rash, no SOB.    History reviewed. No pertinent family history.  History   Social History  . Marital Status: Married    Spouse Name: N/A  . Number of Children: N/A  . Years of Education: N/A   Occupational History  . Not on file.   Social History Main Topics  . Smoking status: Never Smoker   . Smokeless tobacco: Never Used  . Alcohol Use: 0.0 oz/week    0 Standard drinks or equivalent per week     Comment: rarely  . Drug Use: No  . Sexual Activity: Not on file   Other Topics Concern  . Not on file   Social History Narrative     Constitutional: Denies fever, malaise, fatigue, headache or abrupt weight changes.  HEENT: Denies eye pain, eye redness, ear pain, ringing in the ears, wax buildup, runny  nose, nasal congestion, bloody nose, or sore throat. Respiratory: Pt reports shortness of breath. Denies difficulty breathing, cough or sputum production.   Cardiovascular: Pt reports chest pressure. Denies chest pain, chest tightness, palpitations or swelling in the hands or feet.  Gastrointestinal: Denies abdominal pain, bloating, constipation, diarrhea or blood in the stool.  Musculoskeletal: Denies decrease in range of motion, difficulty with gait, muscle pain or joint pain and swelling.  Skin: Denies redness, rashes, lesions or ulcercations.   No other specific complaints in a complete review of systems (except as listed in HPI above).  Objective:   Physical Exam   BP 116/84 mmHg  Pulse 80  Temp(Src) 98.1 F (36.7 C) (Oral)  Wt 187 lb (84.823 kg)  SpO2 97% Wt Readings from Last 3 Encounters:  12/07/14 187 lb (84.823 kg)  06/11/14 183 lb 8 oz (83.235 kg)  12/15/13 178 lb 12 oz (81.08 kg)    General: Appears her stated age, well developed, well nourished in NAD. Skin: Warm, dry and intact. No rashes, lesions or ulcerations noted. Cardiovascular: Normal rate and rhythm. S1,S2 noted.  No murmur, rubs or gallops noted. No JVD or BLE edema. No carotid bruits noted. Pulmonary/Chest: Normal effort and positive vesicular breath sounds. No respiratory distress. No wheezes, rales or ronchi noted.  Abdomen: Soft and nontender. Normal bowel sounds,  no bruits noted. No distention or masses noted.  Musculoskeletal: Pain reproduced with palpation over the sternum. No pain with palpation of the anterior chest wall. Neurological: Alert and oriented.      Assessment & Plan:   Chest pressure and shortness of breath:  Exam benign ECG: normal She declines chest xray today ? MSK in origin- try some Ibuprofen and stretching Continue albuterol inhaler as needed  Follow up with PCP if symptoms persist or worsen

## 2014-12-07 NOTE — Telephone Encounter (Signed)
[name] received call from team health about pt; spoke with Janet Silversmith NP and since almost 10 Am pt can come to office for evaluation. Spoke with pt and she is on her way to office now; pt said had problem with heaviness in mid chest since 12/04/14 and pt does have asthma and thought was related to asthma. Pt said she is doing good and in no distress.

## 2014-12-07 NOTE — Telephone Encounter (Signed)
Pt has appt today at 10 Am with Webb Silversmith NP.

## 2015-09-06 ENCOUNTER — Encounter: Payer: Self-pay | Admitting: Primary Care

## 2015-09-06 ENCOUNTER — Ambulatory Visit (INDEPENDENT_AMBULATORY_CARE_PROVIDER_SITE_OTHER): Payer: BC Managed Care – PPO | Admitting: Primary Care

## 2015-09-06 VITALS — BP 124/76 | HR 96 | Temp 97.6°F | Wt 192.4 lb

## 2015-09-06 DIAGNOSIS — R0989 Other specified symptoms and signs involving the circulatory and respiratory systems: Secondary | ICD-10-CM

## 2015-09-06 DIAGNOSIS — R059 Cough, unspecified: Secondary | ICD-10-CM

## 2015-09-06 DIAGNOSIS — R05 Cough: Secondary | ICD-10-CM | POA: Diagnosis not present

## 2015-09-06 MED ORDER — DM-GUAIFENESIN ER 30-600 MG PO TB12: 1.0000 | ORAL_TABLET | Freq: Two times a day (BID) | ORAL | Status: DC

## 2015-09-06 MED ORDER — AZITHROMYCIN 250 MG PO TABS: ORAL_TABLET | ORAL | Status: DC

## 2015-09-06 NOTE — Patient Instructions (Signed)
Fill the antibiotic in 3 days if no improvement in symptoms.  You may take Mucinex twice daily for chest congestion. Take this will a full glass of water.  Increase consumption of fluids and rest.  It was a pleasure meeting you!

## 2015-09-06 NOTE — Progress Notes (Signed)
Pre visit review using our clinic review tool, if applicable. No additional management support is needed unless otherwise documented below in the visit note. 

## 2015-09-06 NOTE — Progress Notes (Signed)
Subjective:    Patient ID: [name], female    DOB: May 21, 1966, 49 y.o.   MRN: [id number]  HPI  [name] is a 49 year old female, with a history of asthma, who presents today with a chief complaint of cough. She also reports symptoms of nasal congestion , sore throat, and headache. Her symptoms has been present since Tuesday last week, and her cough started last Thursday. She was able to cough up 1 chunk of green mucous yesterday. She's taken expired children mucinex and ibuprofen. She started to feel better Saturday, then felt worse again Sunday.   Review of Systems  Constitutional: Negative for fever and chills.  HENT: Positive for congestion, sore throat and voice change. Negative for ear pain and postnasal drip.   Respiratory: Positive for cough and shortness of breath.   Cardiovascular: Negative for chest pain.  Gastrointestinal: Negative for nausea.       No past medical history on file.  Social History   Social History  . Marital Status: Married    Spouse Name: N/A  . Number of Children: N/A  . Years of Education: N/A   Occupational History  . Not on file.   Social History Main Topics  . Smoking status: Never Smoker   . Smokeless tobacco: Never Used  . Alcohol Use: 0.0 oz/week    0 Standard drinks or equivalent per week     Comment: rarely  . Drug Use: No  . Sexual Activity: Not on file   Other Topics Concern  . Not on file   Social History Narrative    No past surgical history on file.  No family history on file.  Allergies  Allergen Reactions  . Sulfamethoxazole-Trimethoprim     REACTION: rash, no SOB.    Current Outpatient Prescriptions on File Prior to Visit  Medication Sig Dispense Refill  . albuterol (PROVENTIL HFA;VENTOLIN HFA) 108 (90 BASE) MCG/ACT inhaler Inhale 1-2 puffs into the lungs every 6 (six) hours as needed for wheezing or shortness of breath. 1 Inhaler 2  . fluticasone (FLONASE) 50 MCG/ACT nasal spray Place 2 sprays  into both nostrils as needed.   3  . montelukast (SINGULAIR) 10 MG tablet Take 10 mg by mouth at bedtime.     No current facility-administered medications on file prior to visit.    BP 124/76 mmHg  Pulse 96  Temp(Src) 97.6 F (36.4 C) (Oral)  Wt 192 lb 6.4 oz (87.272 kg)  SpO2 98%  LMP 08/24/2015    Objective:   Physical Exam  Constitutional: She appears well-nourished.  HENT:  Right Ear: Tympanic membrane and ear canal normal.  Left Ear: Tympanic membrane and ear canal normal.  Nose: Right sinus exhibits no maxillary sinus tenderness and no frontal sinus tenderness. Left sinus exhibits no maxillary sinus tenderness and no frontal sinus tenderness.  Mouth/Throat: No oropharyngeal exudate, posterior oropharyngeal edema or posterior oropharyngeal erythema.  Eyes: Conjunctivae are normal. Pupils are equal, round, and reactive to light.  Neck: Neck supple.  Cardiovascular: Normal rate and regular rhythm.   Pulmonary/Chest: Effort normal and breath sounds normal. She has no wheezes. She has no rales.  Lymphadenopathy:    She has no cervical adenopathy.  Skin: Skin is warm and dry.          Assessment & Plan:  URI:  Nasal congestion, sore throat, headache since Tuesday, cough since Thursday last week. 1 chunk of green sputum coughed yesterday.  Exam unremarkable. Lungs clear. Suspect viral  URI due to presentation and exam. Will treat for supportive measures and have her fill printed Zpak RX in 3 days if no improvement. Mucinex, fluids, rest. Return precautions provided.

## 2016-02-14 ENCOUNTER — Ambulatory Visit (INDEPENDENT_AMBULATORY_CARE_PROVIDER_SITE_OTHER): Payer: BC Managed Care – PPO | Admitting: Primary Care

## 2016-02-14 ENCOUNTER — Encounter: Payer: Self-pay | Admitting: Primary Care

## 2016-02-14 VITALS — BP 122/80 | HR 65 | Temp 97.9°F | Wt 189.0 lb

## 2016-02-14 DIAGNOSIS — K649 Unspecified hemorrhoids: Secondary | ICD-10-CM

## 2016-02-14 LAB — IFOBT (OCCULT BLOOD): IMMUNOLOGICAL FECAL OCCULT BLOOD TEST: NEGATIVE

## 2016-02-14 MED ORDER — HYDROCORTISONE ACETATE 25 MG RE SUPP: 25.0000 mg | Freq: Two times a day (BID) | RECTAL | Status: DC

## 2016-02-14 NOTE — Patient Instructions (Signed)
I sent a prescription for hemorrhoid medication to your pharmacy. Insert 1 suppository into the rectum twice daily for hemorrhoids.  Please notify myself or Dr. Damita Dunnings if no improvement, or if you notice an increase in hemorrhoid flares.  Ensure you are eating enough fiber and drinking enough water.  Continue Sitz Baths as needed.  It was a pleasure meeting you!  Hemorrhoids Hemorrhoids are swollen veins around the rectum or anus. There are two types of hemorrhoids:   Internal hemorrhoids. These occur in the veins just inside the rectum. They may poke through to the outside and become irritated and painful.  External hemorrhoids. These occur in the veins outside the anus and can be felt as a painful swelling or hard lump near the anus. CAUSES  Pregnancy.   Obesity.   Constipation or diarrhea.   Straining to have a bowel movement.   Sitting for long periods on the toilet.  Heavy lifting or other activity that caused you to strain.  Anal intercourse. SYMPTOMS   Pain.   Anal itching or irritation.   Rectal bleeding.   Fecal leakage.   Anal swelling.   One or more lumps around the anus.  DIAGNOSIS  Your caregiver may be able to diagnose hemorrhoids by visual examination. Other examinations or tests that may be performed include:   Examination of the rectal area with a gloved hand (digital rectal exam).   Examination of anal canal using a small tube (scope).   A blood test if you have lost a significant amount of blood.  A test to look inside the colon (sigmoidoscopy or colonoscopy). TREATMENT Most hemorrhoids can be treated at home. However, if symptoms do not seem to be getting better or if you have a lot of rectal bleeding, your caregiver may perform a procedure to help make the hemorrhoids get smaller or remove them completely. Possible treatments include:   Placing a rubber band at the base of the hemorrhoid to cut off the circulation (rubber band  ligation).   Injecting a chemical to shrink the hemorrhoid (sclerotherapy).   Using a tool to burn the hemorrhoid (infrared light therapy).   Surgically removing the hemorrhoid (hemorrhoidectomy).   Stapling the hemorrhoid to block blood flow to the tissue (hemorrhoid stapling).  HOME CARE INSTRUCTIONS   Eat foods with fiber, such as whole grains, beans, nuts, fruits, and vegetables. Ask your doctor about taking products with added fiber in them (fibersupplements).  Increase fluid intake. Drink enough water and fluids to keep your urine clear or pale yellow.   Exercise regularly.   Go to the bathroom when you have the urge to have a bowel movement. Do not wait.   Avoid straining to have bowel movements.   Keep the anal area dry and clean. Use wet toilet paper or moist towelettes after a bowel movement.   Medicated creams and suppositories may be used or applied as directed.   Only take over-the-counter or prescription medicines as directed by your caregiver.   Take warm sitz baths for 15-20 minutes, 3-4 times a day to ease pain and discomfort.   Place ice packs on the hemorrhoids if they are tender and swollen. Using ice packs between sitz baths may be helpful.   Put ice in a plastic bag.   Place a towel between your skin and the bag.   Leave the ice on for 15-20 minutes, 3-4 times a day.   Do not use a donut-shaped pillow or sit on the toilet for long  periods. This increases blood pooling and pain.  SEEK MEDICAL CARE IF:  You have increasing pain and swelling that is not controlled by treatment or medicine.  You have uncontrolled bleeding.  You have difficulty or you are unable to have a bowel movement.  You have pain or inflammation outside the area of the hemorrhoids. MAKE SURE YOU:  Understand these instructions.  Will watch your condition.  Will get help right away if you are not doing well or get worse.   This information is not intended  to replace advice given to you by your health care provider. Make sure you discuss any questions you have with your health care provider.   Document Released: 09/08/2000 Document Revised: 08/28/2012 Document Reviewed: 07/16/2012 Elsevier Interactive Patient Education [employer].

## 2016-02-14 NOTE — Progress Notes (Signed)
   Subjective:    Patient ID: [name], female    DOB: 07/17/1966, 50 y.o.   MRN: [id number]  HPI  [name] is a 50 year old female who presents today with a chief complaint of rectal pain. She has a history of hemorrhoids for the past 20 years. Her discomfort began this past weekend. She will typically do hot sitz baths and apply preporation H with temporary improvement, but this has not helped to reduce her symptoms recently. She will experience flares very sparingly. Denies constipation, rectal bleeding, nausea abdominal pain.  Review of Systems  Constitutional: Negative for fatigue.  Gastrointestinal: Positive for rectal pain. Negative for nausea, vomiting, abdominal pain, diarrhea and constipation.       No past medical history on file.   Social History   Social History  . Marital Status: Married    Spouse Name: N/A  . Number of Children: N/A  . Years of Education: N/A   Occupational History  . Not on file.   Social History Main Topics  . Smoking status: Never Smoker   . Smokeless tobacco: Never Used  . Alcohol Use: 0.0 oz/week    0 Standard drinks or equivalent per week     Comment: rarely  . Drug Use: No  . Sexual Activity: Not on file   Other Topics Concern  . Not on file   Social History Narrative    No past surgical history on file.  No family history on file.  Allergies  Allergen Reactions  . Sulfamethoxazole-Trimethoprim     REACTION: rash, no SOB.    Current Outpatient Prescriptions on File Prior to Visit  Medication Sig Dispense Refill  . albuterol (PROVENTIL HFA;VENTOLIN HFA) 108 (90 BASE) MCG/ACT inhaler Inhale 1-2 puffs into the lungs every 6 (six) hours as needed for wheezing or shortness of breath. 1 Inhaler 2  . fluticasone (FLONASE) 50 MCG/ACT nasal spray Place 2 sprays into both nostrils as needed.   3  . montelukast (SINGULAIR) 10 MG tablet Take 10 mg by mouth at bedtime.     No current facility-administered medications  on file prior to visit.    BP 122/80 mmHg  Pulse 65  Temp(Src) 97.9 F (36.6 C) (Oral)  Wt 189 lb (85.73 kg)  SpO2 99%  LMP 02/10/2016    Objective:   Physical Exam  Constitutional: She appears well-nourished.  Cardiovascular: Normal rate.   Pulmonary/Chest: Effort normal.  Abdominal: Soft. Normal appearance and bowel sounds are normal. There is no tenderness.  1/2 cm non thrombosed external hemorrhoid to rectum. Small internal hemorrhoid also palpated during exam. Hemoccult stool card negative.   Skin: Skin is warm and dry.          Assessment & Plan:  Hemorrhoid:  History of for years, occur infrequently. Rectal pain for 2-3 days, no improvement with sitz baths and OTC treatment. Exam today with both internal and external hemorrhoid. Hemoccult stool card negative. RX for [facility] suppositories sent to pharmacy. Discussed instructions for use. She is to notify myself or PCP if no improvement, rectal bleeding, fatigue.

## 2016-02-14 NOTE — Progress Notes (Signed)
Pre visit review using our clinic review tool, if applicable. No additional management support is needed unless otherwise documented below in the visit note. 

## 2016-10-11 ENCOUNTER — Encounter: Payer: Self-pay | Admitting: Obstetrics and Gynecology

## 2016-11-06 NOTE — Progress Notes (Deleted)
      TROS  Objective:     Assessment:     Plan:

## 2016-11-08 ENCOUNTER — Encounter: Payer: Self-pay | Admitting: Obstetrics and Gynecology

## 2016-11-08 ENCOUNTER — Ambulatory Visit (INDEPENDENT_AMBULATORY_CARE_PROVIDER_SITE_OTHER): Payer: BC Managed Care – PPO | Admitting: Obstetrics and Gynecology

## 2016-11-08 VITALS — BP 143/84 | HR 64 | Ht 67.0 in | Wt 197.6 lb

## 2016-11-08 DIAGNOSIS — Z9851 Tubal ligation status: Secondary | ICD-10-CM

## 2016-11-08 DIAGNOSIS — J45909 Unspecified asthma, uncomplicated: Secondary | ICD-10-CM

## 2016-11-08 DIAGNOSIS — N119 Chronic tubulo-interstitial nephritis, unspecified: Secondary | ICD-10-CM

## 2016-11-08 DIAGNOSIS — N889 Noninflammatory disorder of cervix uteri, unspecified: Secondary | ICD-10-CM

## 2016-11-08 DIAGNOSIS — Z01419 Encounter for gynecological examination (general) (routine) without abnormal findings: Secondary | ICD-10-CM | POA: Diagnosis not present

## 2016-11-08 DIAGNOSIS — Z1239 Encounter for other screening for malignant neoplasm of breast: Secondary | ICD-10-CM

## 2016-11-08 DIAGNOSIS — N946 Dysmenorrhea, unspecified: Secondary | ICD-10-CM

## 2016-11-08 DIAGNOSIS — Z98891 History of uterine scar from previous surgery: Secondary | ICD-10-CM | POA: Diagnosis not present

## 2016-11-08 DIAGNOSIS — R638 Other symptoms and signs concerning food and fluid intake: Secondary | ICD-10-CM

## 2016-11-08 DIAGNOSIS — Z1231 Encounter for screening mammogram for malignant neoplasm of breast: Secondary | ICD-10-CM

## 2016-11-08 NOTE — Addendum Note (Signed)
Addended by: Elouise Munroe on: 11/08/2016 04:45 PM   Modules accepted: Orders

## 2016-11-08 NOTE — Progress Notes (Signed)
ANNUAL PREVENTATIVE CARE GYN  ENCOUNTER NOTE  Subjective:       [name] is a 51 y.o. [phone] female here for a routine annual gynecologic exam.  Current complaints: 1.  Heavy periods  2. Painful periods  Menses have changed in recent years. She cannot wait for menopause because of the heaviness of her cycle as well as the pain.   Gynecologic History Patient's last menstrual period was 11/01/2016 (exact date). Contraception: tubal ligation Last Pap: 10/15/2013 unsat./neg. Results were: normal Last mammogram: 10/01/2013 birad 1Results were: normal  Obstetric History OB History  Gravida Para Term Preterm AB Living  6 3 3   3 3   SAB TAB Ectopic Multiple Live Births  2 1     3     # Outcome Date GA Lbr Len/2nd Weight Sex Delivery Anes PTL Lv  6 Term 2007   5 lb 11.2 oz (2.586 kg) M CS-LTranv   LIV     Complications: Placenta Previa  5 Term 2004   8 lb 1.6 oz (3.674 kg) M CS-LTranv   LIV     Complications: Breech delivery  4 Term 1994   8 lb 14.4 oz (4.037 kg) M Vag-Spont   LIV  3 TAB 1988          2 SAB           1 SAB               Past Medical History:  Diagnosis Date  . Constipation   . Environmental allergies     Past Surgical History:  Procedure Laterality Date  . CESAREAN SECTION    . LAPAROSCOPY    . MANDIBLE FRACTURE SURGERY    . TUBAL LIGATION      Current Outpatient Prescriptions on File Prior to Visit  Medication Sig Dispense Refill  . albuterol (PROVENTIL HFA;VENTOLIN HFA) 108 (90 BASE) MCG/ACT inhaler Inhale 1-2 puffs into the lungs every 6 (six) hours as needed for wheezing or shortness of breath. 1 Inhaler 2  . fluticasone (FLONASE) 50 MCG/ACT nasal spray Place 2 sprays into both nostrils as needed.   3  . hydrocortisone (ANUSOL-HC) 25 MG suppository Place 1 suppository (25 mg total) rectally 2 (two) times daily. 12 suppository 0  . montelukast (SINGULAIR) 10 MG tablet Take 10 mg by mouth at bedtime.     No current facility-administered  medications on file prior to visit.     Allergies  Allergen Reactions  . Sulfamethoxazole-Trimethoprim     REACTION: rash, no SOB.    Social History   Social History  . Marital status: Married    Spouse name: N/A  . Number of children: N/A  . Years of education: N/A   Occupational History  . Not on file.   Social History Main Topics  . Smoking status: Never Smoker  . Smokeless tobacco: Never Used  . Alcohol use 0.0 oz/week     Comment: rarely  . Drug use: No  . Sexual activity: Yes    Birth control/ protection: Surgical   Other Topics Concern  . Not on file   Social History Narrative  . No narrative on file    Family History  Problem Relation Age of Onset  . Heart disease Maternal Grandfather   . Breast cancer Neg Hx   . Ovarian cancer Neg Hx   . Colon cancer Neg Hx   . Diabetes Neg Hx     The following portions of the patient's history were  reviewed and updated as appropriate: allergies, current medications, past family history, past medical history, past social history, past surgical history and problem list.  Review of Systems ROS Review of Systems - General ROS: negative for - chills, fatigue, fever, hot flashes, night sweats, weight gain or weight loss Psychological ROS: negative for - anxiety, decreased libido, depression, mood swings, physical abuse or sexual abuse Ophthalmic ROS: negative for - blurry vision, eye pain or loss of vision ENT ROS: negative for - headaches, hearing change, visual changes or vocal changes Allergy and Immunology ROS: negative for - hives, itchy/watery eyes or seasonal allergies Hematological and Lymphatic ROS: negative for - bleeding problems, bruising, swollen lymph nodes or weight loss Endocrine ROS: negative for - galactorrhea, hair pattern changes, hot flashes, malaise/lethargy, mood swings, palpitations, polydipsia/polyuria, skin changes, temperature intolerance or unexpected weight changes Breast ROS: negative for - new  or changing breast lumps or nipple discharge Respiratory ROS: negative for - cough or shortness of breath Cardiovascular ROS: negative for - chest pain, irregular heartbeat, palpitations or shortness of breath Gastrointestinal ROS: no abdominal pain, change in bowel habits, or black or bloody stools Genito-Urinary ROS: no dysuria, trouble voiding, or hematuria. POSITIVE-dysmenorrhea, menorrhagia Musculoskeletal ROS: negative for - joint pain or joint stiffness Neurological ROS: negative for - bowel and bladder control changes Dermatological ROS: negative for rash and skin lesion changes   Objective:   BP (!) 143/84   Pulse 64   Ht 5\' 7"  (1.702 m)   Wt 197 lb 9.6 oz (89.6 kg)   LMP 11/01/2016 (Exact Date)   BMI 30.95 kg/m  CONSTITUTIONAL: Well-developed, well-nourished female in no acute distress.  PSYCHIATRIC: Normal mood and affect. Normal behavior. Normal judgment and thought content. [place]: Alert and oriented to person, place, and time. Normal muscle tone coordination. No cranial nerve deficit noted. HENT:  Normocephalic, atraumatic, External right and left ear normal. Oropharynx is clear and moist EYES: Conjunctivae and EOM are normal. Pupils are equal, round, and reactive to light. No scleral icterus.  NECK: Normal range of motion, supple, no masses.  Normal thyroid.  SKIN: Skin is warm and dry. No rash noted. Not diaphoretic. No erythema. No pallor. CARDIOVASCULAR: Normal heart rate noted, regular rhythm, no murmur. RESPIRATORY: Clear to auscultation bilaterally. Effort and breath sounds normal, no problems with respiration noted. BREASTS: Symmetric in size. No masses, skin changes, nipple drainage, or lymphadenopathy. ABDOMEN: Soft, normal bowel sounds, no distention noted.  No tenderness, rebound or guarding.  BLADDER: Normal PELVIC:  External Genitalia: Normal  BUS: Normal  Vagina: Normal; blood in vaginal vault, minimal  Cervix: Normal; powder burn implants at 11:00 on  cervix (biopsied)  Uterus: Normal; midplane, top normal size, mobile, nontender; nonpalpable nontender  [place]nexa: Normal  RV: External Exam NormaI, No Rectal Masses and Normal Sphincter tone  MUSCULOSKELETAL: Normal range of motion. No tenderness.  No cyanosis, clubbing, or edema.  2+ distal pulses. LYMPHATIC: No Axillary, Supraclavicular, or Inguinal Adenopathy.    Assessment:   Annual gynecologic examination 52 y.o. Contraception: tubal ligation bmi-30 Problem List Items Addressed This Visit    None    Visit Diagnoses    Well woman exam with routine gynecological exam    -  Primary   Screening for breast cancer        Lesion of cervix, 5 mm powder burn implant; considering patient's worsening dysmenorrhea and menorrhagia, cannot rule out endometriosis  Plan:  Pap: Pap Co Test Mammogram: Ordered Stool Guaiac Testing:  Not Indicated Labs: declined  Routine preventative health maintenance measures emphasized: Exercise/Diet/Weight control, Tobacco Warnings, Alcohol/Substance use risks and Safe Sex Cervical biopsy-11:00 Return to [place], CMA  Brayton Mars, MD  Note: This dictation was prepared with Dragon dictation along with smaller phrase technology. Any transcriptional errors that result from this process are unintentional.

## 2016-11-08 NOTE — Patient Instructions (Addendum)
1. Pap smear is done 2. Mammogram is ordered 3. Continue with healthy eating and exercise 4. Continue taking Tums for calcium supplementation 5. Recommend vitamin D 1000 units a day 6. Screening labs are ordered 7. Contraception-BTL 8. Return in 1 year   Cervical Biopsy, Care After Introduction Refer to this sheet in the next few weeks. These instructions provide you with information about caring for yourself after your procedure. Your health care provider may also give you more specific instructions. Your treatment has been planned according to current medical practices, but problems sometimes occur. Call your health care provider if you have any problems or questions after your procedure. What can I expect after the procedure? After the procedure, it is common to have:  Cramping or mild pain for a few days.  Slight bleeding from the vagina for a few days.  Dark-colored vaginal discharge for a few days. Follow these instructions at home:  Take over-the-counter and prescription medicines only as told by your health care provider.  Return to your normal activities as told by your health care provider. Ask your health care provider what activities are safe for you.  Use a sanitary napkin until bleeding and discharge stop.  Do not use tampons until your health care provider approves.  Do not douche until your health care provider approves.  Do not have sex until your health care provider approves.  Keep all follow-up visits as told by your health care provider. This is important. Contact a health care provider if:  You have a fever or chills.  You have bad-smelling vaginal discharge.  You have itching or irritation around the vagina.  You have lower abdominal pain. Get help right away if:  You develop heavy vaginal bleeding that soaks more than one sanitary pad an hour.  You faint.  You have very bad lower abdominal pain. This information is not intended to replace  advice given to you by your health care provider. Make sure you discuss any questions you have with your health care provider. Document Released: 06/02/2015 Document Revised: 02/17/2016 Document Reviewed: 01/27/2015  2017 [employer]    Health Maintenance, Female Introduction Adopting a healthy lifestyle and getting preventive care can go a long way to promote health and wellness. Talk with your health care provider about what schedule of regular examinations is right for you. This is a good chance for you to check in with your provider about disease prevention and staying healthy. In between checkups, there are plenty of things you can do on your own. Experts have done a lot of research about which lifestyle changes and preventive measures are most likely to keep you healthy. Ask your health care provider for more information. Weight and diet Eat a healthy diet  Be sure to include plenty of vegetables, fruits, low-fat dairy products, and lean protein.  Do not eat a lot of foods high in solid fats, added sugars, or salt.  Get regular exercise. This is one of the most important things you can do for your health.  Most adults should exercise for at least 150 minutes each week. The exercise should increase your heart rate and make you sweat (moderate-intensity exercise).  Most adults should also do strengthening exercises at least twice a week. This is in addition to the moderate-intensity exercise. Maintain a healthy weight  Body mass index (BMI) is a measurement that can be used to identify possible weight problems. It estimates body fat based on height and weight. Your health care provider  can help determine your BMI and help you achieve or maintain a healthy weight.  For females 32 years of age and older:  A BMI below 18.5 is considered underweight.  A BMI of 18.5 to 24.9 is normal.  A BMI of 25 to 29.9 is considered overweight.  A BMI of 30 and above is considered obese. Watch  levels of cholesterol and blood lipids  You should start having your blood tested for lipids and cholesterol at 49 years of age, then have this test every 5 years.  You may need to have your cholesterol levels checked more often if:  Your lipid or cholesterol levels are high.  You are older than 49 years of age.  You are at high risk for heart disease. Cancer screening Lung Cancer  Lung cancer screening is recommended for adults 65-61 years old who are at high risk for lung cancer because of a history of smoking.  A yearly low-dose CT scan of the lungs is recommended for people who:  Currently smoke.  Have quit within the past 15 years.  Have at least a 30-pack-year history of smoking. A pack year is smoking an average of one pack of cigarettes a day for 1 year.  Yearly screening should continue until it has been 15 years since you quit.  Yearly screening should stop if you develop a health problem that would prevent you from having lung cancer treatment. Breast Cancer  Practice breast self-awareness. This means understanding how your breasts normally appear and feel.  It also means doing regular breast self-exams. Let your health care provider know about any changes, no matter how small.  If you are in your 20s or 30s, you should have a clinical breast exam (CBE) by a health care provider every 1-3 years as part of a regular health exam.  If you are 27 or older, have a CBE every year. Also consider having a breast X-ray (mammogram) every year.  If you have a family history of breast cancer, talk to your health care provider about genetic screening.  If you are at high risk for breast cancer, talk to your health care provider about having an MRI and a mammogram every year.  Breast cancer gene (BRCA) assessment is recommended for women who have family members with BRCA-related cancers. BRCA-related cancers include:  Breast.  Ovarian.  Tubal.  Peritoneal  cancers.  Results of the assessment will determine the need for genetic counseling and BRCA1 and BRCA2 testing. Cervical Cancer  Your health care provider may recommend that you be screened regularly for cancer of the pelvic organs (ovaries, uterus, and vagina). This screening involves a pelvic examination, including checking for microscopic changes to the surface of your cervix (Pap test). You may be encouraged to have this screening done every 3 years, beginning at age 23.  For women ages 14-65, health care providers may recommend pelvic exams and Pap testing every 3 years, or they may recommend the Pap and pelvic exam, combined with testing for human papilloma virus (HPV), every 5 years. Some types of HPV increase your risk of cervical cancer. Testing for HPV may also be done on women of any age with unclear Pap test results.  Other health care providers may not recommend any screening for nonpregnant women who are considered low risk for pelvic cancer and who do not have symptoms. Ask your health care provider if a screening pelvic exam is right for you.  If you have had past treatment for cervical  cancer or a condition that could lead to cancer, you need Pap tests and screening for cancer for at least 20 years after your treatment. If Pap tests have been discontinued, your risk factors (such as having a new sexual partner) need to be reassessed to determine if screening should resume. Some women have medical problems that increase the chance of getting cervical cancer. In these cases, your health care provider may recommend more frequent screening and Pap tests. Colorectal Cancer  This type of cancer can be detected and often prevented.  Routine colorectal cancer screening usually begins at 49 years of age and continues through 49 years of age.  Your health care provider may recommend screening at an earlier age if you have risk factors for colon cancer.  Your health care provider may also  recommend using home test kits to check for hidden blood in the stool.  A small camera at the end of a tube can be used to examine your colon directly (sigmoidoscopy or colonoscopy). This is done to check for the earliest forms of colorectal cancer.  Routine screening usually begins at age 4.  Direct examination of the colon should be repeated every 5-10 years through 49 years of age. However, you may need to be screened more often if early forms of precancerous polyps or small growths are found. Skin Cancer  Check your skin from head to toe regularly.  Tell your health care provider about any new moles or changes in moles, especially if there is a change in a mole's shape or color.  Also tell your health care provider if you have a mole that is larger than the size of a pencil eraser.  Always use sunscreen. Apply sunscreen liberally and repeatedly throughout the day.  Protect yourself by wearing long sleeves, pants, a wide-brimmed hat, and sunglasses whenever you are outside. Heart disease, diabetes, and high blood pressure  High blood pressure causes heart disease and increases the risk of stroke. High blood pressure is more likely to develop in:  People who have blood pressure in the high end of the normal range (130-139/85-89 mm Hg).  People who are overweight or obese.  People who are African American.  If you are 75-59 years of age, have your blood pressure checked every 3-5 years. If you are 59 years of age or older, have your blood pressure checked every year. You should have your blood pressure measured twice-once when you are at a hospital or clinic, and once when you are not at a hospital or clinic. Record the average of the two measurements. To check your blood pressure when you are not at a hospital or clinic, you can use:  An automated blood pressure machine at a pharmacy.  A home blood pressure monitor.  If you are between 61 years and 43 years old, ask your health  care provider if you should take aspirin to prevent strokes.  Have regular diabetes screenings. This involves taking a blood sample to check your fasting blood sugar level.  If you are at a normal weight and have a low risk for diabetes, have this test once every three years after 49 years of age.  If you are overweight and have a high risk for diabetes, consider being tested at a younger age or more often. Preventing infection Hepatitis B  If you have a higher risk for hepatitis B, you should be screened for this virus. You are considered at high risk for hepatitis B if:  You  were born in a country where hepatitis B is common. Ask your health care provider which countries are considered high risk.  Your parents were born in a high-risk country, and you have not been immunized against hepatitis B (hepatitis B vaccine).  You have HIV or AIDS.  You use needles to inject street drugs.  You live with someone who has hepatitis B.  You have had sex with someone who has hepatitis B.  You get hemodialysis treatment.  You take certain medicines for conditions, including cancer, organ transplantation, and autoimmune conditions. Hepatitis C  Blood testing is recommended for:  Everyone born from [birth year] through 1963.  Anyone with known risk factors for hepatitis C. Sexually transmitted infections (STIs)  You should be screened for sexually transmitted infections (STIs) including gonorrhea and chlamydia if:  You are sexually active and are younger than 49 years of age.  You are older than 49 years of age and your health care provider tells you that you are at risk for this type of infection.  Your sexual activity has changed since you were last screened and you are at an increased risk for chlamydia or gonorrhea. Ask your health care provider if you are at risk.  If you do not have HIV, but are at risk, it may be recommended that you take a prescription medicine daily to prevent HIV  infection. This is called pre-exposure prophylaxis (PrEP). You are considered at risk if:  You are sexually active and do not regularly use condoms or know the HIV status of your partner(s).  You take drugs by injection.  You are sexually active with a partner who has HIV. Talk with your health care provider about whether you are at high risk of being infected with HIV. If you choose to begin PrEP, you should first be tested for HIV. You should then be tested every 3 months for as long as you are taking PrEP. Pregnancy  If you are premenopausal and you may become pregnant, ask your health care provider about preconception counseling.  If you may become pregnant, take 400 to 800 micrograms (mcg) of folic acid every day.  If you want to prevent pregnancy, talk to your health care provider about birth control (contraception). Osteoporosis and menopause  Osteoporosis is a disease in which the bones lose minerals and strength with aging. This can result in serious bone fractures. Your risk for osteoporosis can be identified using a bone density scan.  If you are 53 years of age or older, or if you are at risk for osteoporosis and fractures, ask your health care provider if you should be screened.  Ask your health care provider whether you should take a calcium or vitamin D supplement to lower your risk for osteoporosis.  Menopause may have certain physical symptoms and risks.  Hormone replacement therapy may reduce some of these symptoms and risks. Talk to your health care provider about whether hormone replacement therapy is right for you. Follow these instructions at home:  Schedule regular health, dental, and eye exams.  Stay current with your immunizations.  Do not use any tobacco products including cigarettes, chewing tobacco, or electronic cigarettes.  If you are pregnant, do not drink alcohol.  If you are breastfeeding, limit how much and how often you drink alcohol.  Limit  alcohol intake to no more than 1 drink per day for nonpregnant women. One drink equals 12 ounces of beer, 5 ounces of wine, or 1 ounces of hard liquor.  Do  not use street drugs.  Do not share needles.  Ask your health care provider for help if you need support or information about quitting drugs.  Tell your health care provider if you often feel depressed.  Tell your health care provider if you have ever been abused or do not feel safe at home. This information is not intended to replace advice given to you by your health care provider. Make sure you discuss any questions you have with your health care provider. Document Released: 03/27/2011 Document Revised: 02/17/2016 Document Reviewed: 06/15/2015  2017 [employer]vier

## 2016-11-10 LAB — PAP IG AND HPV HIGH-RISK
HPV, HIGH-RISK: NEGATIVE
PAP Smear Comment: 0

## 2016-11-13 LAB — PATHOLOGY

## 2016-11-14 ENCOUNTER — Telehealth: Payer: Self-pay | Admitting: Obstetrics and Gynecology

## 2016-11-14 NOTE — Telephone Encounter (Signed)
Spoke with pt- see pap smear results.

## 2016-11-14 NOTE — Telephone Encounter (Signed)
Pt returning Crystal's call  Thanks teri

## 2017-01-22 ENCOUNTER — Telehealth: Payer: Self-pay | Admitting: Obstetrics and Gynecology

## 2017-01-22 NOTE — Telephone Encounter (Signed)
Period for 10 days, heavy flow and usually she has 4-5 days and this one is really different. Is this normal  Please call

## 2017-01-23 NOTE — Telephone Encounter (Signed)
Pt states her cycle has lasted for 10 days. At times it was heavy. Mild cramps, relieved with ibup. Appt made for 02/01/2017 at 8:45.

## 2017-01-29 ENCOUNTER — Ambulatory Visit (INDEPENDENT_AMBULATORY_CARE_PROVIDER_SITE_OTHER): Payer: BC Managed Care – PPO | Admitting: Family Medicine

## 2017-01-29 ENCOUNTER — Encounter: Payer: Self-pay | Admitting: Family Medicine

## 2017-01-29 VITALS — BP 122/76 | HR 77 | Temp 97.8°F | Wt 195.2 lb

## 2017-01-29 DIAGNOSIS — J069 Acute upper respiratory infection, unspecified: Secondary | ICD-10-CM | POA: Diagnosis not present

## 2017-01-29 MED ORDER — IBUPROFEN 200 MG PO TABS: 400.0000 mg | ORAL_TABLET | Freq: Three times a day (TID) | ORAL | 0 refills | Status: DC | PRN

## 2017-01-29 NOTE — Progress Notes (Signed)
Pre visit review using our clinic review tool, if applicable. No additional management support is needed unless otherwise documented below in the visit note. 

## 2017-01-29 NOTE — Progress Notes (Signed)
Fatigued recently.  This is atypical for the patient.   Then nasal congestion, then some chest congestion.  The cough.  Some yellow sputum.  She is about 4-5 days into the process.   Facial congestion, in the sinuses.   Still on flonase.   No fevers.  No vomiting, no diarrhea.   Mult sick exposures note at school.   She is out of work today.    Per HPI unless specifically indicated in ROS section   Meds, vitals, and allergies reviewed.   GEN: nad, alert and oriented HEENT: mucous membranes moist, TM w/o erythema, nasal epithelium injected, OP with cobblestoning, sinuses slightly ttp x 4 NECK: supple w/o LA CV: rrr. PULM: ctab, no inc wob ABD: soft, +bs EXT: no edema

## 2017-01-29 NOTE — Patient Instructions (Signed)
Drink plenty of fluids, take ibuprofen as needed with food, and gargle with warm salt water for your throat if needed  Use mucinex in the AM with a lot of water.  This should gradually improve.  Take care.  Let us know if you have other concerns.

## 2017-01-29 NOTE — Assessment & Plan Note (Signed)
Likely viral. Nontoxic. Discussed with patient. See after visit summary.

## 2017-02-01 ENCOUNTER — Encounter: Payer: Self-pay | Admitting: Obstetrics and Gynecology

## 2017-02-01 ENCOUNTER — Ambulatory Visit (INDEPENDENT_AMBULATORY_CARE_PROVIDER_SITE_OTHER): Payer: BC Managed Care – PPO | Admitting: Obstetrics and Gynecology

## 2017-02-01 VITALS — Ht 67.0 in

## 2017-02-01 DIAGNOSIS — N921 Excessive and frequent menstruation with irregular cycle: Secondary | ICD-10-CM | POA: Insufficient documentation

## 2017-02-01 DIAGNOSIS — N946 Dysmenorrhea, unspecified: Secondary | ICD-10-CM

## 2017-02-01 DIAGNOSIS — N926 Irregular menstruation, unspecified: Secondary | ICD-10-CM

## 2017-02-01 LAB — POCT URINE PREGNANCY: PREG TEST UR: NEGATIVE

## 2017-02-01 NOTE — Progress Notes (Signed)
GYN ENCOUNTER NOTE  Subjective:       [name] is a 51 y.o. [phone] female is here for gynecologic evaluation of the following issues:  1. Irregular menses 2. Menorrhagia 3. Dysmenorrhea  Menses have become heavier, longer in duration, and more painful. Patient reports that she is noticing large clots along with a heavier flow. She states that menses typically used to last about 5 - 7 days but has had two cycles since November that have lasted 10 - 11 days.     Gynecologic History No LMP recorded. 01/11/2017 (approximate date) Contraception: tubal ligation Cervical biopsy-CIN-1 (powder burn lesion)  Obstetric History OB History  Gravida Para Term Preterm AB Living  6 3 3   3 3   SAB TAB Ectopic Multiple Live Births  2 1     3     # Outcome Date GA Lbr Len/2nd Weight Sex Delivery Anes PTL Lv  6 Term 2007   5 lb 11.2 oz (2.586 kg) M CS-LTranv   LIV     Complications: Placenta Previa  5 Term 2004   8 lb 1.6 oz (3.674 kg) M CS-LTranv   LIV     Complications: Breech delivery  4 Term 1994   8 lb 14.4 oz (4.037 kg) M Vag-Spont   LIV  3 TAB 1988          2 SAB           1 SAB               Past Medical History:  Diagnosis Date  . Constipation   . Environmental allergies     Past Surgical History:  Procedure Laterality Date  . CESAREAN SECTION    . LAPAROSCOPY    . MANDIBLE FRACTURE SURGERY    . TUBAL LIGATION      Current Outpatient Prescriptions on File Prior to Visit  Medication Sig Dispense Refill  . fluticasone (FLONASE) 50 MCG/ACT nasal spray Place 2 sprays into both nostrils as needed.   3  . ibuprofen (ADVIL) 200 MG tablet Take 2-3 tablets (400-600 mg total) by mouth every 8 (eight) hours as needed (with food). 100 tablet 0  . montelukast (SINGULAIR) 10 MG tablet Take 10 mg by mouth at bedtime.     No current facility-administered medications on file prior to visit.     Allergies  Allergen Reactions  . Sulfamethoxazole-Trimethoprim     REACTION:  rash, no SOB.    Social History   Social History  . Marital status: Married    Spouse name: N/A  . Number of children: N/A  . Years of education: N/A   Occupational History  . Not on file.   Social History Main Topics  . Smoking status: Never Smoker  . Smokeless tobacco: Never Used  . Alcohol use 0.0 oz/week     Comment: rarely  . Drug use: No  . Sexual activity: Yes    Birth control/ protection: Surgical   Other Topics Concern  . Not on file   Social History Narrative  . No narrative on file    Family History  Problem Relation Age of Onset  . Heart disease Maternal Grandfather   . Uterine cancer Mother   . Breast cancer Neg Hx   . Ovarian cancer Neg Hx   . Colon cancer Neg Hx   . Diabetes Neg Hx     The following portions of the patient's history were reviewed and updated as appropriate: allergies, current medications,  past family history, past medical history, past social history, past surgical history and problem list.  Review of Systems Review of Systems  Constitutional: Positive for malaise/fatigue. Negative for chills, diaphoresis, fever and weight loss.  HENT: Negative.   Eyes: Negative.   Respiratory: Positive for cough. Negative for hemoptysis, sputum production, shortness of breath and wheezing.   Cardiovascular: Negative.   Gastrointestinal: Positive for heartburn. Negative for abdominal pain, blood in stool, constipation, diarrhea, melena, nausea and vomiting.  Genitourinary: Negative.   Musculoskeletal: Negative.   Skin: Negative.   Neurological: Positive for dizziness. Negative for tingling, tremors, sensory change, speech change, focal weakness, seizures, loss of consciousness, weakness and headaches.  Psychiatric/Behavioral:       Cyclic depressive symptoms    Objective:     Ht 5\' 7"  (1.702 m)  CONSTITUTIONAL: Well-developed, well-nourished female in no acute distress.  HENT:  Normocephalic, atraumatic.  NECK: Normal range of motion,  supple, no masses.  Normal thyroid.  SKIN: Skin is warm and dry. No rash noted. Not diaphoretic. No erythema. No pallor. [place]: Alert and oriented to person, place, and time. PSYCHIATRIC: Normal mood and affect. Normal behavior. Normal judgment and thought content. CARDIOVASCULAR:Not Examined RESPIRATORY: Not Examined BREASTS: Not Examined ABDOMEN: Soft, non distended; Non tender.  No Organomegaly. PELVIC:  External Genitalia: Normal  BUS: Normal  Vagina: Normal; no blood in vault  Cervix: Normal; no lesions  Uterus: Top normal size, shape,consistency, mobile, nontender  Adnexa: Normal; nonpalpable and nontender  RV: Normal external exam  Bladder: Nontender MUSCULOSKELETAL: Normal range of motion. No tenderness.  No cyanosis, clubbing, or edema  PROCEDURE:Endometrial Biopsy Procedure Note  Pre-operative Diagnosis: Menorrhagia  Post-operative Diagnosis: menorrhagia   Procedure Details   Urine pregnancy test was not done.  The risks (including infection, bleeding, pain, and uterine perforation) and benefits of the procedure were explained to the patient and Verbal informed consent was obtained.  Antibiotic prophylaxis against endocarditis was not indicated.   The patient was placed in the dorsal lithotomy position.  Bimanual exam showed the uterus to be in the anteroflexed position.  A Graves' speculum inserted in the vagina, and the cervix prepped with povidone iodine.  Endocervical curettage with a Kevorkian curette was not performed.   A sharp tenaculum was applied to the anterior lip of the cervix for stabilization.  A sterile uterine sound was used to sound the uterus to a depth of 10 cm.  A Mylex 96mm curette was used to sample the endometrium.  Sample was sent for pathologic examination.  Condition: Stable  Complications: None  Plan:  The patient was advised to call for any fever or for prolonged or severe pain or bleeding. She was advised to use OTC acetaminophen and  OTC ibuprofen as needed for mild to moderate pain. She was advised to avoid vaginal intercourse for 48 hours or until the bleeding has completely stopped.  Attending Physician Documentation: Brayton Mars, MD      Assessment:   1. Irregular menses - POCT urine pregnancy - Pathology  2. Menorrhagia with irregular cycle - US Pelvis Complete; Future - US Transvaginal Non-OB; Future - Pathology  3. Dysmenorrhea   4. 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 Pelvic Ultrasound ordered 2. Endometrial biopsy performed 2. Return in 10 days after ultrasound to discuss results and further management  A total of 15 minutes were spent face-to-face with the patient during this encounter and over half of that time dealt  with counseling and coordination of care.  Brayton Mars, MD    I have seen, interviewed, and examined the patient in conjunction with the [facility].A. student and affirm the diagnosis and management plan. Ryleah Miramontes A. Gwyndolyn Guilford, MD, FACOG   Note: This dictation was prepared with Dragon dictation along with smaller phrase technology. Any transcriptional errors that result from this process are unintentional.

## 2017-02-01 NOTE — Patient Instructions (Addendum)
1. Endometrial biopsy is done today 2. Ultrasound is scheduled 3. Return in 10 days for follow-up and further management planning   Endometrial Biopsy, Care After This sheet gives you information about how to care for yourself after your procedure. Your health care provider may also give you more specific instructions. If you have problems or questions, contact your health care provider. What can I expect after the procedure? After the procedure, it is common to have:  Mild cramping.  A small amount of vaginal bleeding for a few days. This is normal. Follow these instructions at home:  Take over-the-counter and prescription medicines only as told by your health care provider.  Do not douche, use tampons, or have sexual intercourse until your health care provider approves.  Return to your normal activities as told by your health care provider. Ask your health care provider what activities are safe for you.  Follow instructions from your health care provider about any activity restrictions, such as restrictions on strenuous exercise or heavy lifting. Contact a health care provider if:  You have heavy bleeding, or bleed for longer than 2 days after the procedure.  You have bad smelling discharge from your vagina.  You have a fever or chills.  You have a burning sensation when urinating or you have difficulty urinating.  You have severe pain in your lower abdomen. Get help right away if:  You have severe cramps in your stomach or back.  You pass large blood clots.  Your bleeding increases.  You become weak or light-headed, or you pass out. Summary  After the procedure, it is common to have mild cramping and a small amount of vaginal bleeding for a few days.  Do not douche, use tampons, or have sexual intercourse until your health care provider approves.  Return to your normal activities as told by your health care provider. Ask your health care provider what activities are  safe for you. This information is not intended to replace advice given to you by your health care provider. Make sure you discuss any questions you have with your health care provider. Document Released: 07/02/2013 Document Revised: 09/27/2016 Document Reviewed: 09/27/2016 Elsevier Interactive Patient Education  2017 [employer].

## 2017-02-02 ENCOUNTER — Ambulatory Visit (INDEPENDENT_AMBULATORY_CARE_PROVIDER_SITE_OTHER): Payer: BC Managed Care – PPO

## 2017-02-02 DIAGNOSIS — N921 Excessive and frequent menstruation with irregular cycle: Secondary | ICD-10-CM | POA: Diagnosis not present

## 2017-02-04 ENCOUNTER — Encounter: Payer: Self-pay | Admitting: Obstetrics and Gynecology

## 2017-02-05 LAB — PATHOLOGY

## 2017-02-06 ENCOUNTER — Telehealth: Payer: Self-pay

## 2017-02-06 NOTE — Telephone Encounter (Signed)
Mad viewed emb and u/s. Pt needs d&c and hysteroscopy. May have progesterone or ocp for now. Pt aware. At this time she states the bleeding is not has heavy at the moment. Changing pads every hour. Mild cramps. Not lightheaded or dizzy. Offered Norethindrone 15 mg a day. Pt declines any meds. F/u appt is Monday. Pt aware if bleeding any heavier or cramps not relieved with tylenol she needs to contact office.

## 2017-02-12 ENCOUNTER — Encounter: Payer: Self-pay | Admitting: Obstetrics and Gynecology

## 2017-02-12 ENCOUNTER — Ambulatory Visit (INDEPENDENT_AMBULATORY_CARE_PROVIDER_SITE_OTHER): Payer: BC Managed Care – PPO | Admitting: Obstetrics and Gynecology

## 2017-02-12 VITALS — BP 119/72 | HR 70 | Ht 67.0 in | Wt 196.7 lb

## 2017-02-12 DIAGNOSIS — N870 Mild cervical dysplasia: Secondary | ICD-10-CM

## 2017-02-12 DIAGNOSIS — D251 Intramural leiomyoma of uterus: Secondary | ICD-10-CM | POA: Diagnosis not present

## 2017-02-12 DIAGNOSIS — N946 Dysmenorrhea, unspecified: Secondary | ICD-10-CM

## 2017-02-12 DIAGNOSIS — N921 Excessive and frequent menstruation with irregular cycle: Secondary | ICD-10-CM | POA: Diagnosis not present

## 2017-02-12 DIAGNOSIS — D252 Subserosal leiomyoma of uterus: Secondary | ICD-10-CM | POA: Diagnosis not present

## 2017-02-12 NOTE — Progress Notes (Signed)
Chief complaint: 1. Menorrhagia with regular cycles 2. Dysmenorrhea 3. History of CIN-1 of the cervix  [name] presents for follow-up on endometrial biopsy and pelvic ultrasound. Findings are reviewed. Endometrial biopsy:Diagnosis:  ENDOMETRIUM, BIOPSY:  SECRETORY ENDOMETRIUM WITH A DISORDERED PATTERN. NO HYPERPLASIA OR  CARCINOMA.  MQA/02/05/2017  Ultrasound:Indications: Menorrhagia with irregular cycles and dysmenorrhea Findings:  The uterus is anteverted and measures 9.8 x 5.0 x 5.8 cm. Echo texture is heterogenous with evidence of focal masses. Within the uterus are multiple suspected fibroids measuring: Fibroid 1: 1.7 x 0.9 x 1.3 cm. Posterior fundal and appears intramural. Fibroid 2: 3.3 x 2.0 x 2.3 cm. Anterior fundal and appears subserosal.  The Endometrium is thickened and measures 20.7 mm.  Right Ovary is not visualized. Right adnexa was surveyed and appears WNL. Left Ovary measures 3.3 x 2.7 x 2.2 cm, and appears WNL. Left adnexa was surveyed and appears WNL. There is no free fluid in the cul de sac.  Impression: 1. Uterus with fibroids. 2. Thickened endometrium.  OBJECTIVE: BP 119/72   Pulse 70   Ht 5\' 7"  (1.702 m)   Wt 196 lb 11.2 oz (89.2 kg)   LMP 02/02/2017 (Exact Date)   BMI 30.81 kg/m  Physical exam-deferred  ASSESSMENT: 1. Menorrhagia with regular cycles 2. Benign endometrial biopsy 3. Thickened endometrium on ultrasound; uterine fibroid 2 on ultrasound 4. Dysmenorrhea 5. History of CIN-1 of the cervix  PLAN: 1. Return for colposcopy 04/19/2017 2. Schedule hysteroscopy/D&C with NovaSure endometrial ablation on 05/07/2017 3. Management options for menorrhagia were stressed including Mirena IUD, hysteroscopy/D&C, hysteroscopy/D&C with NovaSure ablation. Patient is desiring to proceed with the endometrial ablation trial. Patient also understands that there is a slight risk for post-ablation ligation syndrome which could cause chronic pain and  possible need for hysterectomy. 4. Patient will return in 1 week prior to surgery for preoperative appointment  A total of 15 minutes were spent face-to-face with the patient during this encounter and over half of that time dealt with counseling and coordination of care.   Brayton Mars, MD  Note: This dictation was prepared with Dragon dictation along with smaller phrase technology. Any transcriptional errors that result from this process are unintentional.

## 2017-02-12 NOTE — Patient Instructions (Signed)
1. Colposcopy appointment is rescheduled to 04/18/2017 2. NovaSure ablation with hysteroscopy/D&C is scheduled for 05/07/2017 3. Return for preoperative appointment the week before surgery

## 2017-04-18 ENCOUNTER — Encounter: Payer: Self-pay | Admitting: Obstetrics and Gynecology

## 2017-04-18 ENCOUNTER — Ambulatory Visit (INDEPENDENT_AMBULATORY_CARE_PROVIDER_SITE_OTHER): Payer: BC Managed Care – PPO | Admitting: Obstetrics and Gynecology

## 2017-04-18 VITALS — BP 144/78 | HR 77 | Ht 67.0 in | Wt 197.2 lb

## 2017-04-18 DIAGNOSIS — N870 Mild cervical dysplasia: Secondary | ICD-10-CM | POA: Diagnosis not present

## 2017-04-18 NOTE — Progress Notes (Signed)
Chief complaint: 1. CIN-1 of cervix on Pap smear 2. Colposcopy 3. Menorrhagia  Patient presents for colposcopy today for evaluation of VIN 1 noted on Pap smear. Patient was previously scheduled for hysteroscopy/D&C with NovaSure ablation on 05/07/2017; she has decided to cancel the surgery as her bleeding apparently has normalized.  [name] got married in June of this year. [name] is a nonsmoker  Abnormal Pap smear history: 04/11/2010 Pap smear-unsatisfactory due to extreme scant cellularity 11/08/2016 Pap smear-mild dysplasia 11/08/2016 colposcopy directed biopsies-LGSIL (CIN-1)   OBJECTIVE: BP (!) 144/78   Pulse 77   Ht 5\' 7"  (1.702 m)   Wt 197 lb 3.2 oz (89.4 kg)   LMP 03/29/2017 (Approximate)   BMI 30.89 kg/m  Pleasant well-appearing female in no acute distress. Alert and oriented. Pelvic exam: External genitalia normal BUS-normal Vagina-normal Cervix-parous; no gross lesions; no significant discharge Uterus-not examined Adnexa-not examined Rectovaginal-normal external exam  PROCEDURE: Colposcopy of cervix and upper adjacent vagina with biopsies and ECC Indications: History of CIN-1 Findings: Inadequate colposcopy; possible acetowhite epithelium extending up the endocervical canal; abnormal vessel at 6:00 and 12:00 Biopsies:  ECC  Cervix 6:00  Cervix 12:00 Description: Patient gives verbal consent. She was placed in the dorsal lithotomy position. A Graves' speculum was placed into the vagina facilitate visualization of the cervix. The cervix and upper vagina are painted with acetic acid solution on Fox swabs. Colposcopy is performed with the above-noted findings being documented. ECC is performed. Cervical biopsy at 6:00 and 12:00 are obtained. Monsel solution is applied for hemostasis. Blood loss is minimal. Complications are none.  ASSESSMENT: 1. CIN-1 of the cervix 2. Status post colposcopy today, inadequate, due to possible lesion extending into the  canal 3. ECC, and cervical biopsy at 6:00 and cervical biopsy at 12:00 4. Return in 6 months for repeat colposcopy 5. History of menorrhagia, resolved at this time; Desires to cancel NovaSure ablation  PLAN: 1. Colposcopy with biopsies as noted 2. Return in 6 months for repeat colposcopy 3. Endometrial ablation was canceled  A total of 15 minutes were spent face-to-face with the patient during this encounter and over half of that time dealt with counseling and coordination of care.  Brayton Mars, MD  Note: This dictation was prepared with Dragon dictation along with smaller phrase technology. Any transcriptional errors that result from this process are unintentional.

## 2017-04-18 NOTE — Patient Instructions (Signed)
1. Colposcopy is performed today along with biopsies 2. Results will be made available 3. Return in 6 months for repeat colposcopy   Colposcopy, Care After This sheet gives you information about how to care for yourself after your procedure. Your health care provider may also give you more specific instructions. If you have problems or questions, contact your health care provider. What can I expect after the procedure? If you had a colposcopy without a biopsy, you can expect to feel fine right away, but you may have some spotting for a few days. You can go back to your normal activities. If you had a colposcopy with a biopsy, it is common to have:  Soreness and pain. This may last for a few days.  Light-headedness.  Mild vaginal bleeding or dark-colored, grainy discharge. This may last for a few days. The discharge may be due to a solution that was used during the procedure. You may need to wear a sanitary pad during this time.  Spotting for at least 48 hours after the procedure.  Follow these instructions at home:  Take over-the-counter and prescription medicines only as told by your health care provider. Talk with your health care provider about what type of over-the-counter pain medicine and prescription medicine you can start taking again. It is especially important to talk with your health care provider if you take blood-thinning medicine.  Do not drive or use heavy machinery while taking prescription pain medicine.  For at least 3 days after your procedure, or as long as told by your health care provider, avoid: ? Douching. ? Using tampons. ? Having sexual intercourse.  Continue to use birth control (contraception).  Limit your physical activity for the first day after the procedure as told by your health care provider. Ask your health care provider what activities are safe for you.  It is up to you to get the results of your procedure. Ask your health care provider, or the  department performing the procedure, when your results will be ready.  Keep all follow-up visits as told by your health care provider. This is important. Contact a health care provider if:  You develop a skin rash. Get help right away if:  You are bleeding heavily from your vagina or you are passing blood clots. This includes using more than one sanitary pad per hour for 2 hours in a row.  You have a fever or chills.  You have pelvic pain.  You have abnormal, yellow-colored, or bad-smelling vaginal discharge. This could be a sign of infection.  You have severe pain or cramps in your lower abdomen that do not get better with medicine.  You feel light-headed or dizzy, or you faint. Summary  If you had a colposcopy without a biopsy, you can expect to feel fine right away, but you may have some spotting for a few days. You can go back to your normal activities.  If you had a colposcopy with a biopsy, you may notice mild pain and spotting for 48 hours after the procedure.  Avoid douching, using tampons, and having sexual intercourse for 3 days after the procedure or as long as told by your health care provider.  Contact your health care provider if you have bleeding, severe pain, or signs of infection. This information is not intended to replace advice given to you by your health care provider. Make sure you discuss any questions you have with your health care provider. Document Released: 07/02/2013 Document Revised: 04/28/2016 Document Reviewed:  04/28/2016 Elsevier Interactive Patient Education  [employer].

## 2017-04-20 LAB — PATHOLOGY

## 2017-05-01 ENCOUNTER — Other Ambulatory Visit: Payer: BC Managed Care – PPO

## 2017-05-01 ENCOUNTER — Encounter: Payer: BC Managed Care – PPO | Admitting: Obstetrics and Gynecology

## 2017-05-07 ENCOUNTER — Encounter: Payer: Self-pay | Admitting: Family Medicine

## 2017-05-07 ENCOUNTER — Telehealth: Payer: Self-pay | Admitting: Family Medicine

## 2017-05-07 ENCOUNTER — Ambulatory Visit
Admission: RE | Admit: 2017-05-07 | Payer: BC Managed Care – PPO | Source: Ambulatory Visit | Admitting: Obstetrics and Gynecology

## 2017-05-07 ENCOUNTER — Encounter: Admission: RE | Payer: Self-pay | Source: Ambulatory Visit

## 2017-05-07 SURGERY — DILATATION & CURETTAGE/HYSTEROSCOPY WITH NOVASURE ABLATION
Anesthesia: General

## 2017-05-07 NOTE — Telephone Encounter (Signed)
Patient Name: [name]  DOB: 28-Nov-1965    Initial Comment Caller states she needs appt. Having SOB all the time.    Nurse Assessment  Nurse: Leilani Merl, RN, Heather Date/Time (Eastern Time): 05/07/2017 10:02:42 AM  Confirm and document reason for call. If symptomatic, describe symptoms. ---Caller states that she has been having SOB on exertion for the last few months.  Does the patient have any new or worsening symptoms? ---Yes  Will a triage be completed? ---Yes  Related visit to physician within the last 2 weeks? ---No  Does the PT have any chronic conditions? (i.e. diabetes, asthma, etc.) ---Yes  List chronic conditions. ---See MR  Is the patient pregnant or possibly pregnant? (Ask all females between the ages of 76-55) ---No  Is this a behavioral health or substance abuse call? ---No     Guidelines    Guideline Title Affirmed Question Affirmed Notes  Breathing Difficulty [1] MODERATE longstanding difficulty breathing (e.g., speaks in phrases, SOB even at rest, pulse 100-120) AND [2] SAME as normal    Final Disposition User   See PCP When Office is Open (within 3 days) Standifer, RN, Nira Conn    Comments  Appt with Dr. Damita Dunnings at 930 am tomorrow.   Referrals  REFERRED TO PCP OFFICE   Disagree/Comply: Comply

## 2017-05-08 ENCOUNTER — Encounter: Payer: Self-pay | Admitting: Family Medicine

## 2017-05-08 ENCOUNTER — Ambulatory Visit (INDEPENDENT_AMBULATORY_CARE_PROVIDER_SITE_OTHER): Payer: BC Managed Care – PPO | Admitting: Family Medicine

## 2017-05-08 VITALS — BP 130/82 | HR 63 | Temp 97.9°F | Wt 198.2 lb

## 2017-05-08 DIAGNOSIS — J4530 Mild persistent asthma, uncomplicated: Secondary | ICD-10-CM | POA: Diagnosis not present

## 2017-05-08 DIAGNOSIS — R0602 Shortness of breath: Secondary | ICD-10-CM | POA: Diagnosis not present

## 2017-05-08 MED ORDER — ALBUTEROL SULFATE HFA 108 (90 BASE) MCG/ACT IN AERS: 2.0000 | INHALATION_SPRAY | Freq: Four times a day (QID) | RESPIRATORY_TRACT | 1 refills | Status: DC | PRN

## 2017-05-08 MED ORDER — MONTELUKAST SODIUM 10 MG PO TABS: 10.0000 mg | ORAL_TABLET | Freq: Every day | ORAL | 3 refills | Status: DC

## 2017-05-08 NOTE — Patient Instructions (Signed)
Start singular at night.  Use albuterol as needed and update me in about 2 weeks, if better/worse/no change.  Gradually return to exercise but avoid the heat of the day.  Take care.  Glad to see you.

## 2017-05-08 NOTE — Progress Notes (Signed)
H/o environmental allergies/asthma, pets/dogs especially but no clear exposures recently.  In the last few months, possibly a year, she has had troubles with SOB on exertion.  Her mother came to visit and was able to outpace patient going up steps.  She is sedentary "not a lot of exercise."  Gradually worse over the last year.  Only with exertion.  No CP.  No BLE edema.  No h/o CAD.  H/o inhaler use in the past, years ago.  Used albuterol as needed prev, but not recently.  Prev was on singulair.  No wheeze at rest or exertion.  No cough.    Meds, vitals, and allergies reviewed.   ROS: Per HPI unless specifically indicated in ROS section   GEN: nad, alert and oriented HEENT: mucous membranes moist NECK: supple w/o LA CV: rrr.  no murmur PULM: ctab, no inc wob ABD: soft, +bs EXT: no edema SKIN: no acute rash  EKG w/o acute changes.

## 2017-05-09 NOTE — Assessment & Plan Note (Signed)
Symptoms likely due to asthma. EKG reassuring. Discussed with patient.Start singular at night.  Use albuterol as needed and update me in about 2 weeks, if better/worse/no change.  Gradually return to exercise but avoid the heat of the day.  She agrees. Okay for outpatient follow-up.

## 2017-10-07 ENCOUNTER — Encounter: Payer: Self-pay | Admitting: Obstetrics and Gynecology

## 2017-11-12 ENCOUNTER — Ambulatory Visit: Payer: BC Managed Care – PPO | Admitting: Family Medicine

## 2017-11-12 ENCOUNTER — Encounter: Payer: Self-pay | Admitting: Family Medicine

## 2017-11-12 VITALS — BP 132/76 | HR 111 | Temp 99.5°F | Wt 199.5 lb

## 2017-11-12 DIAGNOSIS — R69 Illness, unspecified: Secondary | ICD-10-CM | POA: Diagnosis not present

## 2017-11-12 DIAGNOSIS — J111 Influenza due to unidentified influenza virus with other respiratory manifestations: Secondary | ICD-10-CM | POA: Insufficient documentation

## 2017-11-12 DIAGNOSIS — R509 Fever, unspecified: Secondary | ICD-10-CM

## 2017-11-12 LAB — POC INFLUENZA A&B (BINAX/QUICKVUE)
INFLUENZA A, POC: NEGATIVE
INFLUENZA B, POC: NEGATIVE

## 2017-11-12 NOTE — Patient Instructions (Addendum)
Flu swab negative.  You do have flu-like illness.  Push fluids, rest, may take tylenol/ibuprofen for body aches.  Take ibuprofen 400-600mg  with meals for next few days.  Continue mucinex. If fever >101 past 5 days, worsening productive cough, or just not improving as expected, let us know.

## 2017-11-12 NOTE — Progress Notes (Signed)
BP 132/76 (BP Location: Left Arm, Patient Position: Sitting, Cuff Size: Normal)   Pulse (!) 111   Temp 99.5 F (37.5 C) (Oral)   Wt 199 lb 8 oz (90.5 kg)   LMP 11/04/2017   SpO2 97%   BMI 31.25 kg/m    CC: URI Subjective:    Patient ID: [name], female    DOB: 12-27-1965, 52 y.o.   MRN: [id number]  HPI: [name] is a 52 y.o. female presenting on 11/12/2017 for URI (Dry cough, burning in chest, feverish, achiness, sore throat and fatigue. Started last night. Tried Mucinex, ibuprofen and Tylenol.)   Sudden onset last night chills, feverish, fatigue, ST, non productive cough, body aches.  Denies ear or tooth pain, dyspnea or wheezing. No significant congestion.  Treating with mucinex, ibuprofen and tylenol.  Husband sick at home.  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   Relevant past medical, surgical, family and social history reviewed and updated as indicated. Interim medical history since our last visit reviewed. Allergies and medications reviewed and updated. Outpatient Medications Prior to Visit  Medication Sig Dispense Refill  . albuterol (PROVENTIL HFA;VENTOLIN HFA) 108 (90 Base) MCG/ACT inhaler Inhale 2 puffs into the lungs every 6 (six) hours as needed for wheezing or shortness of breath (use prior to exercise). 1 Inhaler 1  . montelukast (SINGULAIR) 10 MG tablet Take 1 tablet (10 mg total) by mouth at bedtime. 90 tablet 3   No facility-administered medications prior to visit.      Per HPI unless specifically indicated in ROS section below Review of Systems     Objective:    BP 132/76 (BP Location: Left Arm, Patient Position: Sitting, Cuff Size: Normal)   Pulse (!) 111   Temp 99.5 F (37.5 C) (Oral)   Wt 199 lb 8 oz (90.5 kg)   LMP 11/04/2017   SpO2 97%   BMI 31.25 kg/m   Wt Readings from Last 3 Encounters:  11/12/17 199 lb 8 oz (90.5 kg)  05/08/17 198 lb 4 oz (89.9 kg)  04/18/17 197 lb 3.2 oz (89.4  kg)    Physical Exam  Constitutional: She appears well-developed and well-nourished. No distress.  Tired but nontoxic  HENT:  Head: Normocephalic and atraumatic.  Right Ear: Hearing, tympanic membrane, external ear and ear canal normal.  Left Ear: Hearing, tympanic membrane, external ear and ear canal normal.  Nose: No mucosal edema or rhinorrhea. Right sinus exhibits no maxillary sinus tenderness and no frontal sinus tenderness. Left sinus exhibits no maxillary sinus tenderness and no frontal sinus tenderness.  Mouth/Throat: Uvula is midline, oropharynx is clear and moist and mucous membranes are normal. No oropharyngeal exudate, posterior oropharyngeal edema, posterior oropharyngeal erythema or tonsillar abscesses.  Nasal mucosal erythema  Eyes: Conjunctivae and EOM are normal. Pupils are equal, round, and reactive to light. No scleral icterus.  Neck: Normal range of motion. Neck supple.  Cardiovascular: Normal rate, regular rhythm, normal heart sounds and intact distal pulses.  No murmur heard. Pulmonary/Chest: Effort normal and breath sounds normal. No respiratory distress. She has no wheezes. She has no rales.  Lungs largely clear  Lymphadenopathy:    She has no cervical adenopathy.  Skin: Skin is warm and dry. No rash noted.  Nursing note and vitals reviewed.  Results for orders placed or performed in visit on 11/12/17  POC Influenza A&B(BINAX/QUICKVUE)  Result Value Ref Range   Influenza A, POC Negative Negative   Influenza B, POC  Negative Negative      Assessment & Plan:   Problem List Items Addressed This Visit    Influenza-like illness - Primary    Flu swab today negative Anticipate flu like illness. Supportive care reviewed. Discussed pros/cons of tamiflu - will not Rx.  Red flags to seek further care reviewed.        Other Visit Diagnoses    Fever, unspecified fever cause       Relevant Orders   POC Influenza A&B(BINAX/QUICKVUE) (Completed)       No orders  of the defined types were placed in this encounter.  Orders Placed This Encounter  Procedures  . POC Influenza A&B(BINAX/QUICKVUE)    Follow up plan: Return if symptoms worsen or fail to improve.  Ria Bush, MD

## 2017-11-12 NOTE — Assessment & Plan Note (Addendum)
Flu swab today negative Anticipate flu like illness. Supportive care reviewed. Discussed pros/cons of tamiflu - will not Rx.  Red flags to seek further care reviewed.

## 2017-11-13 ENCOUNTER — Encounter: Payer: Self-pay | Admitting: Family Medicine

## 2017-11-14 ENCOUNTER — Telehealth: Payer: Self-pay | Admitting: Family Medicine

## 2017-11-14 NOTE — Telephone Encounter (Signed)
Unfortunately I do recommend re evaluation in office if feeling worse - as no localizing symptoms when seen earlier in the week. In interim recommend scheduled tylenol 500-1000mg  three times daily.  Would offer next available appointment with any provider.

## 2017-11-14 NOTE — Telephone Encounter (Signed)
Agree with recheck.  Thanks for trying to get her in.

## 2017-11-14 NOTE — Telephone Encounter (Signed)
Spoke with pt relaying message per Dr. Chinita Pester appt with [name] at 4:00 today but pt declined.  Says that's ok.

## 2017-11-14 NOTE — Telephone Encounter (Signed)
Copied from [place]. Topic: Quick Communication - See Telephone Encounter >> Nov 14, 2017  8:27 AM Conception Chancy, NT wrote: CRM for notification. See Telephone encounter for:  11/14/17.  Patient is calling stating she was seen on 11/12/17 and was told her symptoms were all viral. Patient states she is feeling worse today and her whole right side hurts, ears, head, throat. She would like to know if she can get an antibiotic without coming in since she was just in the office on 11/12/17. Please advise.   [id number] - [place], [place] [address]

## 2017-12-03 ENCOUNTER — Other Ambulatory Visit: Payer: Self-pay | Admitting: Family Medicine

## 2017-12-03 NOTE — Telephone Encounter (Signed)
Electronic refill request. Albuterol Last office visit:   11/12/17 Acute Last Filled:    1 Inhaler 1 05/08/2017  Please advise.

## 2017-12-04 ENCOUNTER — Telehealth: Payer: Self-pay

## 2017-12-04 NOTE — Telephone Encounter (Signed)
Copied from [place] [phone]. Topic: Referral - Request >> Dec 04, 2017  4:06 PM Neva Seat wrote: Pt is needing a referral to a large OBGYN practice near the area >> Dec 04, 2017  4:23 PM Ahmed Prima L wrote: Patient is just looking for a obgyn for regular women check ups.

## 2017-12-04 NOTE — Telephone Encounter (Signed)
[facility].

## 2017-12-04 NOTE — Telephone Encounter (Signed)
Sent. Thanks.   

## 2017-12-04 NOTE — Telephone Encounter (Signed)
I tried to contact pt to see why needs OBGYN referral but unable to reach pt on phone.

## 2017-12-04 NOTE — Telephone Encounter (Signed)
Copied from [place] [id number]. Topic: Referral - Request >> Dec 04, 2017  4:06 PM Neva Seat wrote: Pt is needing a referral to a large OBGYN practice near the area

## 2017-12-05 NOTE — Telephone Encounter (Signed)
Agreed.  She likely won't need a referral.  If she does, then let me know.  Thanks.

## 2017-12-05 NOTE — Telephone Encounter (Signed)
Patient returned call, given message from Dr. Damita Dunnings on 12/05/17, she verbalized understanding.

## 2017-12-05 NOTE — Telephone Encounter (Signed)
Left message on voicemail for patient to call back. 

## 2017-12-12 ENCOUNTER — Encounter: Payer: BC Managed Care – PPO | Admitting: Obstetrics and Gynecology

## 2017-12-12 ENCOUNTER — Encounter: Payer: BC Managed Care – PPO | Admitting: Family Medicine

## 2017-12-25 ENCOUNTER — Encounter: Payer: BC Managed Care – PPO | Admitting: Obstetrics and Gynecology

## 2018-01-09 ENCOUNTER — Encounter: Payer: BC Managed Care – PPO | Admitting: Family Medicine

## 2018-01-16 ENCOUNTER — Encounter: Payer: Self-pay | Admitting: Obstetrics and Gynecology

## 2018-01-16 ENCOUNTER — Ambulatory Visit: Payer: BC Managed Care – PPO | Admitting: Obstetrics and Gynecology

## 2018-01-16 VITALS — BP 127/82 | HR 69 | Ht 67.0 in | Wt 197.4 lb

## 2018-01-16 DIAGNOSIS — Z800 Family history of malignant neoplasm of digestive organs: Secondary | ICD-10-CM

## 2018-01-16 DIAGNOSIS — Z8049 Family history of malignant neoplasm of other genital organs: Secondary | ICD-10-CM | POA: Insufficient documentation

## 2018-01-16 DIAGNOSIS — Z98891 History of uterine scar from previous surgery: Secondary | ICD-10-CM | POA: Insufficient documentation

## 2018-01-16 DIAGNOSIS — N921 Excessive and frequent menstruation with irregular cycle: Secondary | ICD-10-CM | POA: Diagnosis not present

## 2018-01-16 NOTE — Patient Instructions (Signed)
1.  Endometrial biopsy is performed today 2.  LAVH BSO is scheduled for June 2019 3.  Return for preop appointment the week before surgery 4.  Information regarding genetic testing for common cancer syndromes is given.  Recommend the GI/GU/breast cancer screening  Laparoscopically Assisted Vaginal Hysterectomy A laparoscopically assisted vaginal hysterectomy (LAVH) is a surgical procedure to remove the uterus and cervix, and sometimes the ovaries and fallopian tubes. During an LAVH, some of the surgical removal is done through the vagina, and the rest is done through a few small surgical cuts (incisions) in the abdomen. This procedure is usually considered in women when a vaginal hysterectomy is not an option. Your health care provider will discuss the risks and benefits of the different surgical techniques at your appointment. Generally, recovery time is faster and there are fewer complications after laparoscopic procedures than after open incisional procedures. Tell a health care provider about:  Any allergies you have.  All medicines you are taking, including vitamins, herbs, eye drops, creams, and over-the-counter medicines.  Any problems you or family members have had with anesthetic medicines.  Any blood disorders you have.  Any surgeries you have had.  Any medical conditions you have. What are the risks? Generally, this is a safe procedure. However, as with any procedure, complications can occur. Possible complications include:  Allergies to medicines.  Difficulty breathing.  Bleeding.  Infection.  Damage to other structures near your uterus and cervix.  What happens before the procedure?  Ask your health care provider about changing or stopping your regular medicines.  Take certain medicines, such as a colon-emptying preparation, as directed.  Do not eat or drink anything for at least 8 hours before your surgery.  Stop smoking if you smoke. Stopping will improve your  health after surgery.  Arrange for a ride home after surgery and for help at home during recovery. What happens during the procedure?  An IV tube will be put into one of your veins in order to give you fluids and medicines.  You will receive medicines to relax you and medicines that make you sleep (general anesthetic).  You may have a flexible tube (catheter) put into your bladder to drain urine.  You may have a tube put through your nose or mouth that goes into your stomach (nasogastric tube). The nasogastric tube removes digestive fluids and prevents you from feeling nauseated and from vomiting.  Tight-fitting (compression) stockings will be placed on your legs to promote circulation.  Three to four small incisions will be made in your abdomen. An incision also will be made in your vagina. Probes and tools will be inserted into the small incisions. The uterus and cervix are removed (and possibly your ovaries and fallopian tubes) through your vagina as well as through the small incisions that were made in the abdomen.  Your vagina is then sewn back to normal. What happens after the procedure?  You may have a liquid diet temporarily. You will most likely return to, and tolerate, your usual diet the day after surgery.  You will be passing urine through a catheter. It will be removed the day after surgery.  Your temperature, breathing rate, heart rate, blood pressure, and oxygen level will be monitored regularly.  You will still wear compression stockings on your legs until you are able to move around.  You will use a special device or do breathing exercises to keep your lungs clear.  You will be encouraged to walk as soon as possible.  This information is not intended to replace advice given to you by your health care provider. Make sure you discuss any questions you have with your health care provider. Document Released: 08/31/2011 Document Revised: 02/17/2016 Document Reviewed:  03/27/2013 Elsevier Interactive Patient Education  [employer].

## 2018-01-16 NOTE — Addendum Note (Signed)
Addended by: Elouise Munroe on: 01/16/2018 03:49 PM   Modules accepted: Orders

## 2018-01-16 NOTE — Progress Notes (Signed)
GYN ENCOUNTER NOTE  Subjective:       [name] is a 52 y.o. [id number] female is here for gynecologic evaluation of the following issues:  1.  Menorrhagia.    Evalyn reports having a recurrence of heavy menstrual cycles with clots with heaviest bleeding lasting upwards of 14 days.  She does not report significant anemia symptoms.  Mom was recently diagnosed with metastatic colon cancer. She also had uterine cancer diagnosed at age 66. There is a family history of prostate cancer, but no colon or ovary cancer or pancreatic or melanoma  Bowel function is normal.  She has not had a screening colonoscopy   Gynecologic History Patient's last menstrual period was 11/26/2017 (exact date). Contraception: tubal ligation  Obstetric History OB History  Gravida Para Term Preterm AB Living  6 3 3   3 3   SAB TAB Ectopic Multiple Live Births  2 1     3     # Outcome Date GA Lbr Len/2nd Weight Sex Delivery Anes PTL Lv  6 Term 2007   5 lb 11.2 oz (2.586 kg) M CS-LTranv   LIV     Complications: Placenta Previa  5 Term 2004   8 lb 1.6 oz (3.674 kg) M CS-LTranv   LIV     Complications: Breech delivery  4 Term 1994   8 lb 14.4 oz (4.037 kg) M Vag-Spont   LIV  3 TAB 1988          2 SAB           1 SAB             Past Medical History:  Diagnosis Date  . Asthma   . Constipation   . Dysplasia of cervix, low grade (CIN 1)   . Environmental allergies     Past Surgical History:  Procedure Laterality Date  . CESAREAN SECTION    . LAPAROSCOPY    . MANDIBLE FRACTURE SURGERY    . TUBAL LIGATION      Current Outpatient Medications on File Prior to Visit  Medication Sig Dispense Refill  . albuterol (PROVENTIL HFA;VENTOLIN HFA) 108 (90 Base) MCG/ACT inhaler INHALE 2 PUFFS BY MOUTH EVERY 6 HOURS AS NEEDED FOR WHEEZE /BREATH SHORTNESS (USE PRIOR TO EXERCISE) 8.5 Inhaler 1  . montelukast (SINGULAIR) 10 MG tablet Take 1 tablet (10 mg total) by mouth at bedtime. 90 tablet 3   No current  facility-administered medications on file prior to visit.     Allergies  Allergen Reactions  . Sulfamethoxazole-Trimethoprim     REACTION: rash, no SOB.    Social History   Socioeconomic History  . Marital status: Married    Spouse name: Not on file  . Number of children: Not on file  . Years of education: Not on file  . Highest education level: Not on file  Occupational History  . Not on file  Social Needs  . Financial resource strain: Not on file  . Food insecurity:    Worry: Not on file    Inability: Not on file  . Transportation needs:    Medical: Not on file    Non-medical: Not on file  Tobacco Use  . Smoking status: Never Smoker  . Smokeless tobacco: Never Used  Substance and Sexual Activity  . Alcohol use: Yes    Alcohol/week: 0.0 oz    Comment: rarely  . Drug use: No  . Sexual activity: Yes    Birth control/protection: Surgical  Lifestyle  .  Physical activity:    Days per week: Not on file    Minutes per session: Not on file  . Stress: Not on file  Relationships  . Social connections:    Talks on phone: Not on file    Gets together: Not on file    Attends religious service: Not on file    Active member of club or organization: Not on file    Attends meetings of clubs or organizations: Not on file    Relationship status: Not on file  . Intimate partner violence:    Fear of current or ex partner: Not on file    Emotionally abused: Not on file    Physically abused: Not on file    Forced sexual activity: Not on file  Other Topics Concern  . Not on file  Social History Narrative  . Not on file    Family History  Problem Relation Age of Onset  . Heart disease Maternal Grandfather   . Uterine cancer Mother   . Breast cancer Neg Hx   . Ovarian cancer Neg Hx   . Colon cancer Neg Hx   . Diabetes Neg Hx     The following portions of the patient's history were reviewed and updated as appropriate: allergies, current medications, past family history,  past medical history, past social history, past surgical history and problem list.  Review of Systems Review of Systems -comprehensive review of systems is negative except for that noted in the HPI  Objective:   BP 127/82   Pulse 69   Ht 5\' 7"  (1.702 m)   Wt 197 lb 6.4 oz (89.5 kg)   LMP 11/26/2017 (Exact Date)   BMI 30.92 kg/m  CONSTITUTIONAL: Well-developed, well-nourished female in no acute distress.  HENT:  Normocephalic, atraumatic.  NECK: Not examined SKIN: Skin is warm and dry. No rash noted. Not diaphoretic. No erythema. No pallor. [place]: Alert and oriented to person, place, and time. PSYCHIATRIC: Normal mood and affect. Normal behavior. Normal judgment and thought content. CARDIOVASCULAR:Not Examined RESPIRATORY: Not Examined BREASTS: Not Examined ABDOMEN: Soft, non distended; Non tender.  No Organomegaly. PELVIC:  External Genitalia: Normal  BUS: Normal  Vagina: Normal estrogen effect; no lesions: No discharge  Cervix: Normal; parous; no cervical motion tenderness; no lesions  Uterus: Normal size, shape,consistency, mobile, midplane  Adnexa: Normal; nonpalpable nontender  RV: Normal external exam  Bladder: Nontender MUSCULOSKELETAL: Normal range of motion. No tenderness.  No cyanosis, clubbing, or edema.  PROCEDURE:Endometrial Biopsy Procedure Note  Pre-operative Diagnosis: Menorrhagia  Post-operative Diagnosis: same  Procedure Details   Urine pregnancy test was not done.  The risks (including infection, bleeding, pain, and uterine perforation) and benefits of the procedure were explained to the patient and Verbal informed consent was obtained.  Antibiotic prophylaxis against endocarditis was not indicated.   The patient was placed in the dorsal lithotomy position.  Bimanual exam showed the uterus to be in the neutral position.  A Graves' speculum inserted in the vagina.  Endocervical curettage with a Kevorkian curette was not performed.   A sharp tenaculum  was applied to the anterior lip of the cervix for stabilization.  A sterile uterine sound was used to sound the uterus to a depth of 8.5cm.  A Mylex 73mm curette was used to sample the endometrium.  Sample was sent for pathologic examination.  Condition: Stable  Complications: None  Plan:  The patient was advised to call for any fever or for prolonged or severe pain or bleeding.  She was advised to use OTC acetaminophen and OTC ibuprofen as needed for mild to moderate pain. She was advised to avoid vaginal intercourse for 48 hours or until the bleeding has completely stopped.  Attending Physician Documentation: Brayton Mars, MD    Assessment:   1. Menorrhagia with irregular cycle  2. Family history of colon cancer in mother  54. Family history of uterine cancer in mother  48.  Uterine fibroids  5.  History of prior cesarean section     Plan:   1.  Endometrial biopsy; results will be made available through my chart 2.  Referral for colonoscopy 3.  Schedule LAVH BSO for June 2019 4.  Return for preop appointment the week before surgery  A total of 15 minutes were spent face-to-face with the patient during this encounter and over half of that time dealt with counseling and coordination of care.  Brayton Mars, MD  Note: This dictation was prepared with Dragon dictation along with smaller phrase technology. Any transcriptional errors that result from this process are unintentional.

## 2018-01-18 LAB — PATHOLOGY

## 2018-02-27 ENCOUNTER — Encounter: Payer: Self-pay | Admitting: Obstetrics and Gynecology

## 2018-02-27 ENCOUNTER — Ambulatory Visit (INDEPENDENT_AMBULATORY_CARE_PROVIDER_SITE_OTHER): Payer: BC Managed Care – PPO | Admitting: Obstetrics and Gynecology

## 2018-02-27 VITALS — BP 144/81 | HR 91 | Ht 67.0 in | Wt 199.9 lb

## 2018-02-27 DIAGNOSIS — D251 Intramural leiomyoma of uterus: Secondary | ICD-10-CM

## 2018-02-27 DIAGNOSIS — N921 Excessive and frequent menstruation with irregular cycle: Secondary | ICD-10-CM

## 2018-02-27 DIAGNOSIS — Z01818 Encounter for other preprocedural examination: Secondary | ICD-10-CM

## 2018-02-27 DIAGNOSIS — Z98891 History of uterine scar from previous surgery: Secondary | ICD-10-CM

## 2018-02-27 DIAGNOSIS — D252 Subserosal leiomyoma of uterus: Secondary | ICD-10-CM

## 2018-02-27 NOTE — H&P (Signed)
PREOP HISTORY AND PHYSICAL  Date of surgery: 03/18/2018 Procedure: LAVH BSO Diagnosis: 1.  Symptomatic uterine fibroids; menorrhagia with irregular cycles 2.  Family history of uterine cancer in mother 70.  History of prior cesarean section delivery x2   Patient is a 52 y.o. [id number] scheduled for surgery on 03/18/2018. She has been experiencing recurrence of heavy menstrual cycles with clots with the heaviest bleeding lasting upwards of 14 days.  She is not having chronic fatigue, weakness, palpitations, shortness of breath, chest pain. Mom has history of uterine cancer diagnosed at age 13.  Most recently she was diagnosed (mild) with metastatic colon cancer. There is a family history of prostate cancer, without family history of ovary or pancreatic cancer or melanoma. Pertinent Gynecological History: Patient's last menstrual period was 02/11/2018 (exact date). Contraception: tubal ligation Endometrial biopsy (preoperative) 01/16/2018 disordered proliferative endometrium with focal stromal breakdown; no hyperplasia or carcinoma   OB History    Gravida  6   Para  3   Term  3   Preterm      AB  3   Living  3     SAB  2   TAB  1   Ectopic      Multiple      Live Births  3            Patient's last menstrual period was 02/11/2018 (exact date).    Past Medical History:  Diagnosis Date  . Asthma   . Constipation   . Dysplasia of cervix, low grade (CIN 1)   . Environmental allergies     Past Surgical History:  Procedure Laterality Date  . CESAREAN SECTION    . LAPAROSCOPY    . MANDIBLE FRACTURE SURGERY    . TUBAL LIGATION      OB History  Gravida Para Term Preterm AB Living  6 3 3   3 3   SAB TAB Ectopic Multiple Live Births  2 1     3     # Outcome Date GA Lbr Len/2nd Weight Sex Delivery Anes PTL Lv  6 Term 2007   5 lb 11.2 oz (2.586 kg) M CS-LTranv   LIV     Complications: Placenta Previa  5 Term 2004   8 lb 1.6 oz (3.674 kg) M CS-LTranv   LIV      Complications: Breech delivery  4 Term 1994   8 lb 14.4 oz (4.037 kg) M Vag-Spont   LIV  3 TAB 1988          2 SAB           1 SAB             Social History   Socioeconomic History  . Marital status: Married    Spouse name: Not on file  . Number of children: Not on file  . Years of education: Not on file  . Highest education level: Not on file  Occupational History  . Not on file  Social Needs  . Financial resource strain: Not on file  . Food insecurity:    Worry: Not on file    Inability: Not on file  . Transportation needs:    Medical: Not on file    Non-medical: Not on file  Tobacco Use  . Smoking status: Never Smoker  . Smokeless tobacco: Never Used  Substance and Sexual Activity  . Alcohol use: Yes    Alcohol/week: 0.0 oz    Comment: rarely  . Drug use: No  .  Sexual activity: Yes    Birth control/protection: Surgical  Lifestyle  . Physical activity:    Days per week: Not on file    Minutes per session: Not on file  . Stress: Not on file  Relationships  . Social connections:    Talks on phone: Not on file    Gets together: Not on file    Attends religious service: Not on file    Active member of club or organization: Not on file    Attends meetings of clubs or organizations: Not on file    Relationship status: Not on file  Other Topics Concern  . Not on file  Social History Narrative  . Not on file    Family History  Problem Relation Age of Onset  . Heart disease Maternal Grandfather   . Uterine cancer Mother   . Breast cancer Neg Hx   . Ovarian cancer Neg Hx   . Colon cancer Neg Hx   . Diabetes Neg Hx      (Not in a hospital admission)  Allergies  Allergen Reactions  . Sulfamethoxazole-Trimethoprim     REACTION: rash, no SOB.    Review of Systems Constitutional: No recent fever/chills/sweats Respiratory: No recent cough/bronchitis Cardiovascular: No chest pain Gastrointestinal: No recent nausea/vomiting/diarrhea Genitourinary: No UTI  symptoms Hematologic/lymphatic:No history of coagulopathy or recent blood thinner use    Objective:    BP (!) 144/81   Pulse 91   Ht 5\' 7"  (1.702 m)   Wt 199 lb 14.4 oz (90.7 kg)   LMP 02/11/2018 (Exact Date)   BMI 31.31 kg/m   General:   Normal  Skin:   normal  HEENT:  Normal  Neck:  Supple without Adenopathy or Thyromegaly  Lungs:   Heart:              Breasts:   Abdomen:  Pelvis:  M/S   Extremeties:  Neuro:    clear to auscultation bilaterally   Normal without murmur   Not Examined   soft, non-tender; bowel sounds normal; no masses,  no organomegaly   Exam deferred to OR  No CVAT  Warm/Dry   Normal         01/16/2018 PELVIC:             External Genitalia: Normal             BUS: Normal             Vagina: Normal estrogen effect; no lesions: No discharge             Cervix: Normal; parous; no cervical motion tenderness; no lesions             Uterus: Normal size, shape,consistency, mobile, midplane             Adnexa: Normal; nonpalpable nontender             RV: Normal external exam             Bladder: Nontender   Assessment:  1.  Symptomatic fibroid uterus-menorrhagia with irregular cycles 2.  Family history of uterine cancer, mom 3.  History of cesarean section x2    Plan:  LAVH BSO  Preop counseling: Patient is to undergo LAVH BSO for management of symptomatic fibroid uterus.  She is understanding of the planned procedure and is aware of and is accepting of all surgical risks which include but are not limited to bleeding, infection, pelvic organ injury with need for repair, blood clot  disorders, anesthesia risk, etc.  All questions have been answered.  Informed consent is given.  Patient is very willing to proceed with surgery as scheduled.  Brayton Mars, MD  Note: This dictation was prepared with Dragon dictation along with smaller phrase technology. Any transcriptional errors that result from this process are unintentional.

## 2018-02-27 NOTE — Patient Instructions (Signed)
1.  Return for postop check 1 week after surgery on 03/27/2018  Laparoscopically Assisted Vaginal Hysterectomy, Care After Refer to this sheet in the next few weeks. These instructions provide you with information on caring for yourself after your procedure. Your health care provider may also give you more specific instructions. Your treatment has been planned according to current medical practices, but problems sometimes occur. Call your health care provider if you have any problems or questions after your procedure. What can I expect after the procedure? After your procedure, it is typical to have the following:  Abdominal pain. You will be given pain medicine to control it.  Sore throat from the breathing tube that was inserted during surgery.  Follow these instructions at home:  Only take over-the-counter or prescription medicines for pain, discomfort, or fever as directed by your health care provider.  Do not take aspirin. It can cause bleeding.  Do not drive when taking pain medicine.  Follow your health care provider's advice regarding diet, exercise, lifting, driving, and general activities.  Resume your usual diet as directed and allowed.  Get plenty of rest and sleep.  Do not douche, use tampons, or have sexual intercourse for at least 6 weeks, or until your health care provider gives you permission.  Change your bandages (dressings) as directed by your health care provider.  Monitor your temperature and notify your health care provider of a fever.  Take showers instead of baths for 2-3 weeks.  Do not drink alcohol until your health care provider gives you permission.  If you develop constipation, you may take a mild laxative with your health care provider's permission. Bran foods may help with constipation problems. Drinking enough fluids to keep your urine clear or pale yellow may help as well.  Try to have someone home with you for 1-2 weeks to help around the  house.  Keep all of your follow-up appointments as directed by your health care provider. Contact a health care provider if:  You have swelling, redness, or increasing pain around your incision sites.  You have pus coming from your incision.  You notice a bad smell coming from your incision.  Your incision breaks open.  You feel dizzy or lightheaded.  You have pain or bleeding when you urinate.  You have persistent diarrhea.  You have persistent nausea and vomiting.  You have abnormal vaginal discharge.  You have a rash.  You have any type of abnormal reaction or develop an allergy to your medicine.  You have poor pain control with your prescribed medicine. Get help right away if:  You have a fever.  You have severe abdominal pain.  You have chest pain.  You have shortness of breath.  You faint.  You have pain, swelling, or redness in your leg.  You have heavy vaginal bleeding with blood clots. This information is not intended to replace advice given to you by your health care provider. Make sure you discuss any questions you have with your health care provider. Document Released: 08/31/2011 Document Revised: 02/17/2016 Document Reviewed: 03/27/2013 Elsevier Interactive Patient Education  2017 [employer].

## 2018-02-27 NOTE — Progress Notes (Signed)
PREOP HISTORY AND PHYSICAL  Date of surgery: 03/18/2018 Procedure: LAVH BSO Diagnosis: 1.  Symptomatic uterine fibroids; menorrhagia with irregular cycles 2.  Family history of uterine cancer in mother 20.  History of prior cesarean section delivery x2   Patient is a 52 y.o. [id number] scheduled for surgery on 03/18/2018. She has been experiencing recurrence of heavy menstrual cycles with clots with the heaviest bleeding lasting upwards of 14 days.  She is not having chronic fatigue, weakness, palpitations, shortness of breath, chest pain. Mom has history of uterine cancer diagnosed at age 75.  Most recently she was diagnosed (mild) with metastatic colon cancer. There is a family history of prostate cancer, without family history of ovary or pancreatic cancer or melanoma. Pertinent Gynecological History: Patient's last menstrual period was 02/11/2018 (exact date). Contraception: tubal ligation Endometrial biopsy (preoperative) 01/16/2018 disordered proliferative endometrium with focal stromal breakdown; no hyperplasia or carcinoma   OB History    Gravida  6   Para  3   Term  3   Preterm      AB  3   Living  3     SAB  2   TAB  1   Ectopic      Multiple      Live Births  3            Patient's last menstrual period was 02/11/2018 (exact date).    Past Medical History:  Diagnosis Date  . Asthma   . Constipation   . Dysplasia of cervix, low grade (CIN 1)   . Environmental allergies     Past Surgical History:  Procedure Laterality Date  . CESAREAN SECTION    . LAPAROSCOPY    . MANDIBLE FRACTURE SURGERY    . TUBAL LIGATION      OB History  Gravida Para Term Preterm AB Living  6 3 3   3 3   SAB TAB Ectopic Multiple Live Births  2 1     3     # Outcome Date GA Lbr Len/2nd Weight Sex Delivery Anes PTL Lv  6 Term 2007   5 lb 11.2 oz (2.586 kg) M CS-LTranv   LIV     Complications: Placenta Previa  5 Term 2004   8 lb 1.6 oz (3.674 kg) M CS-LTranv   LIV      Complications: Breech delivery  4 Term 1994   8 lb 14.4 oz (4.037 kg) M Vag-Spont   LIV  3 TAB 1988          2 SAB           1 SAB             Social History   Socioeconomic History  . Marital status: Married    Spouse name: Not on file  . Number of children: Not on file  . Years of education: Not on file  . Highest education level: Not on file  Occupational History  . Not on file  Social Needs  . Financial resource strain: Not on file  . Food insecurity:    Worry: Not on file    Inability: Not on file  . Transportation needs:    Medical: Not on file    Non-medical: Not on file  Tobacco Use  . Smoking status: Never Smoker  . Smokeless tobacco: Never Used  Substance and Sexual Activity  . Alcohol use: Yes    Alcohol/week: 0.0 oz    Comment: rarely  . Drug use: No  .  Sexual activity: Yes    Birth control/protection: Surgical  Lifestyle  . Physical activity:    Days per week: Not on file    Minutes per session: Not on file  . Stress: Not on file  Relationships  . Social connections:    Talks on phone: Not on file    Gets together: Not on file    Attends religious service: Not on file    Active member of club or organization: Not on file    Attends meetings of clubs or organizations: Not on file    Relationship status: Not on file  Other Topics Concern  . Not on file  Social History Narrative  . Not on file    Family History  Problem Relation Age of Onset  . Heart disease Maternal Grandfather   . Uterine cancer Mother   . Breast cancer Neg Hx   . Ovarian cancer Neg Hx   . Colon cancer Neg Hx   . Diabetes Neg Hx      (Not in a hospital admission)  Allergies  Allergen Reactions  . Sulfamethoxazole-Trimethoprim     REACTION: rash, no SOB.    Review of Systems Constitutional: No recent fever/chills/sweats Respiratory: No recent cough/bronchitis Cardiovascular: No chest pain Gastrointestinal: No recent nausea/vomiting/diarrhea Genitourinary: No UTI  symptoms Hematologic/lymphatic:No history of coagulopathy or recent blood thinner use    Objective:    BP (!) 144/81   Pulse 91   Ht 5\' 7"  (1.702 m)   Wt 199 lb 14.4 oz (90.7 kg)   LMP 02/11/2018 (Exact Date)   BMI 31.31 kg/m   General:   Normal  Skin:   normal  HEENT:  Normal  Neck:  Supple without Adenopathy or Thyromegaly  Lungs:   Heart:              Breasts:   Abdomen:  Pelvis:  M/S   Extremeties:  Neuro:    clear to auscultation bilaterally   Normal without murmur   Not Examined   soft, non-tender; bowel sounds normal; no masses,  no organomegaly   Exam deferred to OR  No CVAT  Warm/Dry   Normal         01/16/2018 PELVIC:             External Genitalia: Normal             BUS: Normal             Vagina: Normal estrogen effect; no lesions: No discharge             Cervix: Normal; parous; no cervical motion tenderness; no lesions             Uterus: Normal size, shape,consistency, mobile, midplane             Adnexa: Normal; nonpalpable nontender             RV: Normal external exam             Bladder: Nontender   Assessment:  1.  Symptomatic fibroid uterus-menorrhagia with irregular cycles 2.  Family history of uterine cancer, mom 3.  History of cesarean section x2    Plan:  LAVH BSO  Preop counseling: Patient is to undergo LAVH BSO for management of symptomatic fibroid uterus.  She is understanding of the planned procedure and is aware of and is accepting of all surgical risks which include but are not limited to bleeding, infection, pelvic organ injury with need for repair, blood clot  disorders, anesthesia risk, etc.  All questions have been answered.  Informed consent is given.  Patient is very willing to proceed with surgery as scheduled.  Brayton Mars, MD  Note: This dictation was prepared with Dragon dictation along with smaller phrase technology. Any transcriptional errors that result from this process are unintentional.

## 2018-02-27 NOTE — H&P (View-Only) (Signed)
PREOP HISTORY AND PHYSICAL  Date of surgery: 03/18/2018 Procedure: LAVH BSO Diagnosis: 1.  Symptomatic uterine fibroids; menorrhagia with irregular cycles 2.  Family history of uterine cancer in mother 55.  History of prior cesarean section delivery x2   Patient is a 52 y.o. G6P3025female scheduled for surgery on 03/18/2018. She has been experiencing recurrence of heavy menstrual cycles with clots with the heaviest bleeding lasting upwards of 14 days.  She is not having chronic fatigue, weakness, palpitations, shortness of breath, chest pain. Mom has history of uterine cancer diagnosed at age 63.  Most recently she was diagnosed (mild) with metastatic colon cancer. There is a family history of prostate cancer, without family history of ovary or pancreatic cancer or melanoma. Pertinent Gynecological History: Patient's last menstrual period was 02/11/2018 (exact date). Contraception: tubal ligation Endometrial biopsy (preoperative) 01/16/2018 disordered proliferative endometrium with focal stromal breakdown; no hyperplasia or carcinoma   OB History    Gravida  6   Para  3   Term  3   Preterm      AB  3   Living  3     SAB  2   TAB  1   Ectopic      Multiple      Live Births  3            Patient's last menstrual period was 02/11/2018 (exact date).    Past Medical History:  Diagnosis Date  . Asthma   . Constipation   . Dysplasia of cervix, low grade (CIN 1)   . Environmental allergies     Past Surgical History:  Procedure Laterality Date  . CESAREAN SECTION    . LAPAROSCOPY    . MANDIBLE FRACTURE SURGERY    . TUBAL LIGATION      OB History  Gravida Para Term Preterm AB Living  6 3 3   3 3   SAB TAB Ectopic Multiple Live Births  2 1     3     # Outcome Date GA Lbr Len/2nd Weight Sex Delivery Anes PTL Lv  6 Term 2007   5 lb 11.2 oz (2.586 kg) M CS-LTranv   LIV     Complications: Placenta Previa  5 Term 2004   8 lb 1.6 oz (3.674 kg) M CS-LTranv   LIV      Complications: Breech delivery  4 Term 1994   8 lb 14.4 oz (4.037 kg) M Vag-Spont   LIV  3 TAB 1988          2 SAB           1 SAB             Social History   Socioeconomic History  . Marital status: Married    Spouse name: Not on file  . Number of children: Not on file  . Years of education: Not on file  . Highest education level: Not on file  Occupational History  . Not on file  Social Needs  . Financial resource strain: Not on file  . Food insecurity:    Worry: Not on file    Inability: Not on file  . Transportation needs:    Medical: Not on file    Non-medical: Not on file  Tobacco Use  . Smoking status: Never Smoker  . Smokeless tobacco: Never Used  Substance and Sexual Activity  . Alcohol use: Yes    Alcohol/week: 0.0 oz    Comment: rarely  . Drug use: No  .  Sexual activity: Yes    Birth control/protection: Surgical  Lifestyle  . Physical activity:    Days per week: Not on file    Minutes per session: Not on file  . Stress: Not on file  Relationships  . Social connections:    Talks on phone: Not on file    Gets together: Not on file    Attends religious service: Not on file    Active member of club or organization: Not on file    Attends meetings of clubs or organizations: Not on file    Relationship status: Not on file  Other Topics Concern  . Not on file  Social History Narrative  . Not on file    Family History  Problem Relation Age of Onset  . Heart disease Maternal Grandfather   . Uterine cancer Mother   . Breast cancer Neg Hx   . Ovarian cancer Neg Hx   . Colon cancer Neg Hx   . Diabetes Neg Hx      (Not in a hospital admission)  Allergies  Allergen Reactions  . Sulfamethoxazole-Trimethoprim     REACTION: rash, no SOB.    Review of Systems Constitutional: No recent fever/chills/sweats Respiratory: No recent cough/bronchitis Cardiovascular: No chest pain Gastrointestinal: No recent nausea/vomiting/diarrhea Genitourinary: No UTI  symptoms Hematologic/lymphatic:No history of coagulopathy or recent blood thinner use    Objective:    BP (!) 144/81   Pulse 91   Ht 5\' 7"  (1.702 m)   Wt 199 lb 14.4 oz (90.7 kg)   LMP 02/11/2018 (Exact Date)   BMI 31.31 kg/m   General:   Normal  Skin:   normal  HEENT:  Normal  Neck:  Supple without Adenopathy or Thyromegaly  Lungs:   Heart:              Breasts:   Abdomen:  Pelvis:  M/S   Extremeties:  Neuro:    clear to auscultation bilaterally   Normal without murmur   Not Examined   soft, non-tender; bowel sounds normal; no masses,  no organomegaly   Exam deferred to OR  No CVAT  Warm/Dry   Normal         01/16/2018 PELVIC:             External Genitalia: Normal             BUS: Normal             Vagina: Normal estrogen effect; no lesions: No discharge             Cervix: Normal; parous; no cervical motion tenderness; no lesions             Uterus: Normal size, shape,consistency, mobile, midplane             Adnexa: Normal; nonpalpable nontender             RV: Normal external exam             Bladder: Nontender   Assessment:  1.  Symptomatic fibroid uterus-menorrhagia with irregular cycles 2.  Family history of uterine cancer, mom 3.  History of cesarean section x2    Plan:  LAVH BSO  Preop counseling: Patient is to undergo LAVH BSO for management of symptomatic fibroid uterus.  She is understanding of the planned procedure and is aware of and is accepting of all surgical risks which include but are not limited to bleeding, infection, pelvic organ injury with need for repair, blood clot  disorders, anesthesia risk, etc.  All questions have been answered.  Informed consent is given.  Patient is very willing to proceed with surgery as scheduled.  Brayton Mars, MD  Note: This dictation was prepared with Dragon dictation along with smaller phrase technology. Any transcriptional errors that result from this process are unintentional.

## 2018-03-12 ENCOUNTER — Encounter
Admission: RE | Admit: 2018-03-12 | Discharge: 2018-03-12 | Disposition: A | Discharge status: Home / Self Care | Payer: BC Managed Care – PPO | Source: Ambulatory Visit | Attending: Obstetrics and Gynecology | Admitting: Obstetrics and Gynecology

## 2018-03-12 ENCOUNTER — Other Ambulatory Visit: Payer: Self-pay

## 2018-03-12 NOTE — Patient Instructions (Signed)
Your procedure is scheduled on: 03-18-18  Report to Same Day Surgery 2nd floor medical mall [facility] Entrance-take elevator on left to 2nd floor.  Check in with surgery information desk.) To find out your arrival time please call [phone] between 1PM - 3PM on 03-15-18  Remember: Instructions that are not followed completely may result in serious medical risk, up to and including death, or upon the discretion of your surgeon and anesthesiologist your surgery may need to be rescheduled.    _x___ 1. Do not eat food after midnight the night before your procedure. NO GUM OR CANDY AFTER MIDNIGHT.  You may drink clear liquids up to 2 hours before you are scheduled to arrive at the hospital for your procedure.  Do not drink clear liquids within 2 hours of your scheduled arrival to the hospital.  Clear liquids include  --Water or Apple juice without pulp  --Clear carbohydrate beverage such as ClearFast or Gatorade  --Black Coffee or Clear Tea (No milk, no creamers, do not add anything to the coffee or Tea    __x__ 2. No Alcohol for 24 hours before or after surgery.   __x__3. No Smoking or e-cigarettes for 24 prior to surgery.  Do not use any chewable tobacco products for at least 6 hour prior to surgery   ____  4. Bring all medications with you on the day of surgery if instructed.    __x__ 5. Notify your doctor if there is any change in your medical condition     (cold, fever, infections).    x___6. On the morning of surgery brush your teeth with toothpaste and water.  You may rinse your mouth with mouth wash if you wish.  Do not swallow any toothpaste or mouthwash.   Do not wear jewelry, make-up, hairpins, clips or nail polish.  Do not wear lotions, powders, or perfumes. You may wear deodorant.  Do not shave 48 hours prior to surgery. Men may shave face and neck.  Do not bring valuables to the hospital.    [facility] is not responsible for any belongings or valuables.    Contacts, dentures or bridgework may not be worn into surgery.  Leave your suitcase in the car. After surgery it may be brought to your room.  For patients admitted to the hospital, discharge time is determined by your treatment team.  _  Patients discharged the day of surgery will not be allowed to drive home.  You will need someone to drive you home and stay with you the night of your procedure.    Please read over the following fact sheets that you were given:   [facility] Preparing for Surgery and or MRSA Information   ____ Take anti-hypertensive listed below, cardiac, seizure, asthma,anti-reflux and psychiatric medicines. These include:  1. NONE  2.  3.  4.  5.  6.  ____Fleets enema or Magnesium Citrate as directed.   ____ Use CHG Soap or sage wipes as directed on instruction sheet   _X___ Use inhalers on the day of surgery and bring to hospital day of surgery-BRING YOUR ALBUTEROL Spray  ____ Stop Metformin and Janumet 2 days prior to surgery.    ____ Take 1/2 of usual insulin dose the night before surgery and none on the morning surgery.   ____ Follow recommendations from Cardiologist, Pulmonologist or PCP regarding stopping Aspirin, Coumadin, Plavix ,Eliquis, Effient, or Pradaxa, and Pletal.  X____Stop Anti-inflammatories such as Advil, Aleve, Ibuprofen, Motrin, Naproxen, Naprosyn, Goodies  powders or aspirin products NOW-OK to take Tylenol    ____ Stop supplements until after surgery.   ____ Bring C-Pap to the hospital.

## 2018-03-17 MED ORDER — CEFAZOLIN SODIUM-DEXTROSE 2-4 GM/100ML-% IV SOLN
2.0000 g | INTRAVENOUS | Status: AC
  Administered 2018-03-18: 2 g via INTRAVENOUS

## 2018-03-18 ENCOUNTER — Ambulatory Visit: Payer: BC Managed Care – PPO | Admitting: Anesthesiology

## 2018-03-18 ENCOUNTER — Other Ambulatory Visit: Payer: Self-pay

## 2018-03-18 ENCOUNTER — Encounter
Admission: RE | Disposition: A | Discharge status: Home / Self Care | Payer: Self-pay | Source: Ambulatory Visit | Attending: Obstetrics and Gynecology

## 2018-03-18 ENCOUNTER — Encounter: Payer: Self-pay | Admitting: *Deleted

## 2018-03-18 ENCOUNTER — Ambulatory Visit
Admission: RE | Admit: 2018-03-18 | Discharge: 2018-03-19 | Disposition: A | Discharge status: Home / Self Care | Payer: BC Managed Care – PPO | Source: Ambulatory Visit | Attending: Obstetrics and Gynecology | Admitting: Obstetrics and Gynecology

## 2018-03-18 DIAGNOSIS — N946 Dysmenorrhea, unspecified: Secondary | ICD-10-CM | POA: Insufficient documentation

## 2018-03-18 DIAGNOSIS — N921 Excessive and frequent menstruation with irregular cycle: Secondary | ICD-10-CM | POA: Diagnosis present

## 2018-03-18 DIAGNOSIS — N8301 Follicular cyst of right ovary: Secondary | ICD-10-CM | POA: Insufficient documentation

## 2018-03-18 DIAGNOSIS — N920 Excessive and frequent menstruation with regular cycle: Secondary | ICD-10-CM | POA: Diagnosis not present

## 2018-03-18 DIAGNOSIS — D251 Intramural leiomyoma of uterus: Secondary | ICD-10-CM | POA: Diagnosis not present

## 2018-03-18 DIAGNOSIS — Z90710 Acquired absence of both cervix and uterus: Secondary | ICD-10-CM | POA: Diagnosis present

## 2018-03-18 DIAGNOSIS — Z803 Family history of malignant neoplasm of breast: Secondary | ICD-10-CM | POA: Insufficient documentation

## 2018-03-18 HISTORY — PX: LAPAROSCOPIC VAGINAL HYSTERECTOMY WITH SALPINGO OOPHORECTOMY: SHX6681

## 2018-03-18 LAB — CBC WITH DIFFERENTIAL/PLATELET
BASOS ABS: 0 10*3/uL (ref 0–0.1)
BASOS PCT: 1 %
EOS ABS: 0.1 10*3/uL (ref 0–0.7)
Eosinophils Relative: 2 %
HEMATOCRIT: 36.7 % (ref 35.0–47.0)
HEMOGLOBIN: 12.4 g/dL (ref 12.0–16.0)
Lymphocytes Relative: 24 %
Lymphs Abs: 1.8 10*3/uL (ref 1.0–3.6)
MCH: 28.1 pg (ref 26.0–34.0)
MCHC: 33.8 g/dL (ref 32.0–36.0)
MCV: 83.1 fL (ref 80.0–100.0)
MONOS PCT: 9 %
Monocytes Absolute: 0.7 10*3/uL (ref 0.2–0.9)
NEUTROS ABS: 4.8 10*3/uL (ref 1.4–6.5)
NEUTROS PCT: 64 %
Platelets: 259 10*3/uL (ref 150–440)
RBC: 4.42 MIL/uL (ref 3.80–5.20)
RDW: 14.9 % — ABNORMAL HIGH (ref 11.5–14.5)
WBC: 7.4 10*3/uL (ref 3.6–11.0)

## 2018-03-18 LAB — ABO/RH: ABO/RH(D): O NEG

## 2018-03-18 LAB — RAPID HIV SCREEN (HIV 1/2 AB+AG)
HIV 1/2 ANTIBODIES: NONREACTIVE
HIV-1 P24 ANTIGEN - HIV24: NONREACTIVE

## 2018-03-18 LAB — POCT PREGNANCY, URINE: Preg Test, Ur: NEGATIVE

## 2018-03-18 SURGERY — HYSTERECTOMY, VAGINAL, LAPAROSCOPY-ASSISTED, WITH SALPINGO-OOPHORECTOMY
Anesthesia: General | Laterality: Bilateral | Wound class: Clean Contaminated

## 2018-03-18 MED ORDER — FENTANYL CITRATE (PF) 100 MCG/2ML IJ SOLN
INTRAMUSCULAR | Status: DC | PRN
  Administered 2018-03-18: 50 ug via INTRAVENOUS
  Administered 2018-03-18: 100 ug via INTRAVENOUS
  Administered 2018-03-18: 50 ug via INTRAVENOUS

## 2018-03-18 MED ORDER — KETOROLAC TROMETHAMINE 30 MG/ML IJ SOLN
INTRAMUSCULAR | Status: AC
  Administered 2018-03-18: 30 mg via INTRAVENOUS
  Filled 2018-03-18: qty 1

## 2018-03-18 MED ORDER — KETOROLAC TROMETHAMINE 30 MG/ML IJ SOLN
30.0000 mg | Freq: Four times a day (QID) | INTRAMUSCULAR | Status: DC
  Filled 2018-03-18: qty 1

## 2018-03-18 MED ORDER — FENTANYL CITRATE (PF) 100 MCG/2ML IJ SOLN
INTRAMUSCULAR | Status: AC
  Filled 2018-03-18: qty 2

## 2018-03-18 MED ORDER — ONDANSETRON HCL 4 MG/2ML IJ SOLN
INTRAMUSCULAR | Status: AC
  Filled 2018-03-18: qty 2

## 2018-03-18 MED ORDER — ONDANSETRON HCL 4 MG/2ML IJ SOLN: 4.0000 mg | Freq: Once | INTRAMUSCULAR | Status: DC | PRN

## 2018-03-18 MED ORDER — GLYCOPYRROLATE 0.2 MG/ML IJ SOLN
INTRAMUSCULAR | Status: AC
  Filled 2018-03-18: qty 1

## 2018-03-18 MED ORDER — BISACODYL 10 MG RE SUPP: 10.0000 mg | Freq: Every day | RECTAL | Status: DC | PRN

## 2018-03-18 MED ORDER — SIMETHICONE 80 MG PO CHEW: 80.0000 mg | CHEWABLE_TABLET | Freq: Four times a day (QID) | ORAL | Status: DC | PRN

## 2018-03-18 MED ORDER — ACETAMINOPHEN 10 MG/ML IV SOLN
INTRAVENOUS | Status: DC | PRN
  Administered 2018-03-18: 1000 mg via INTRAVENOUS

## 2018-03-18 MED ORDER — HYDROMORPHONE HCL 1 MG/ML IJ SOLN
INTRAMUSCULAR | Status: AC
  Filled 2018-03-18: qty 1

## 2018-03-18 MED ORDER — ROCURONIUM BROMIDE 50 MG/5ML IV SOLN
INTRAVENOUS | Status: AC
  Filled 2018-03-18: qty 1

## 2018-03-18 MED ORDER — ROCURONIUM BROMIDE 100 MG/10ML IV SOLN
INTRAVENOUS | Status: DC | PRN
  Administered 2018-03-18: 10 mg via INTRAVENOUS
  Administered 2018-03-18: 40 mg via INTRAVENOUS

## 2018-03-18 MED ORDER — MIDAZOLAM HCL 2 MG/2ML IJ SOLN
INTRAMUSCULAR | Status: DC | PRN
  Administered 2018-03-18: 2 mg via INTRAVENOUS

## 2018-03-18 MED ORDER — LACTATED RINGERS IV SOLN
INTRAVENOUS | Status: DC
  Administered 2018-03-18: 12:00:00 via INTRAVENOUS

## 2018-03-18 MED ORDER — MIDAZOLAM HCL 2 MG/2ML IJ SOLN
INTRAMUSCULAR | Status: AC
  Filled 2018-03-18: qty 2

## 2018-03-18 MED ORDER — GLYCOPYRROLATE 0.2 MG/ML IJ SOLN
INTRAMUSCULAR | Status: DC | PRN
  Administered 2018-03-18: 0.2 mg via INTRAVENOUS

## 2018-03-18 MED ORDER — MORPHINE SULFATE (PF) 2 MG/ML IV SOLN
1.0000 mg | INTRAVENOUS | Status: DC | PRN
  Administered 2018-03-18 (×2): 1 mg via INTRAVENOUS
  Filled 2018-03-18 (×2): qty 1

## 2018-03-18 MED ORDER — HYDROMORPHONE HCL 1 MG/ML IJ SOLN: 0.2500 mg | INTRAMUSCULAR | Status: DC | PRN

## 2018-03-18 MED ORDER — LACTATED RINGERS IV SOLN
INTRAVENOUS | Status: DC
  Administered 2018-03-18 – 2018-03-19 (×2): via INTRAVENOUS

## 2018-03-18 MED ORDER — PROPOFOL 10 MG/ML IV BOLUS
INTRAVENOUS | Status: AC
  Filled 2018-03-18: qty 20

## 2018-03-18 MED ORDER — CEFAZOLIN SODIUM-DEXTROSE 2-4 GM/100ML-% IV SOLN
INTRAVENOUS | Status: AC
  Filled 2018-03-18: qty 100

## 2018-03-18 MED ORDER — SUGAMMADEX SODIUM 200 MG/2ML IV SOLN
INTRAVENOUS | Status: AC
  Filled 2018-03-18: qty 2

## 2018-03-18 MED ORDER — FAMOTIDINE 20 MG PO TABS
20.0000 mg | ORAL_TABLET | Freq: Once | ORAL | Status: AC
  Administered 2018-03-18: 20 mg via ORAL

## 2018-03-18 MED ORDER — KETOROLAC TROMETHAMINE 30 MG/ML IJ SOLN
30.0000 mg | Freq: Four times a day (QID) | INTRAMUSCULAR | Status: DC
  Administered 2018-03-18 – 2018-03-19 (×3): 30 mg via INTRAVENOUS
  Filled 2018-03-18 (×3): qty 1

## 2018-03-18 MED ORDER — DOCUSATE SODIUM 100 MG PO CAPS
100.0000 mg | ORAL_CAPSULE | Freq: Two times a day (BID) | ORAL | Status: DC
  Administered 2018-03-18 – 2018-03-19 (×2): 100 mg via ORAL
  Filled 2018-03-18 (×2): qty 1

## 2018-03-18 MED ORDER — FENTANYL CITRATE (PF) 100 MCG/2ML IJ SOLN
INTRAMUSCULAR | Status: AC
  Administered 2018-03-18: 25 ug via INTRAVENOUS
  Filled 2018-03-18: qty 2

## 2018-03-18 MED ORDER — FAMOTIDINE 20 MG PO TABS
ORAL_TABLET | ORAL | Status: AC
  Administered 2018-03-18: 20 mg via ORAL
  Filled 2018-03-18: qty 1

## 2018-03-18 MED ORDER — LACTATED RINGERS IV SOLN
INTRAVENOUS | Status: DC
  Administered 2018-03-18: 17:00:00 via INTRAVENOUS

## 2018-03-18 MED ORDER — DEXAMETHASONE SODIUM PHOSPHATE 10 MG/ML IJ SOLN
INTRAMUSCULAR | Status: AC
Start: 2018-03-18 — End: ?
  Filled 2018-03-18: qty 1

## 2018-03-18 MED ORDER — PROPOFOL 10 MG/ML IV BOLUS
INTRAVENOUS | Status: DC | PRN
Start: 2018-03-18 — End: 2018-03-18
  Administered 2018-03-18: 150 mg via INTRAVENOUS

## 2018-03-18 MED ORDER — DEXAMETHASONE SODIUM PHOSPHATE 10 MG/ML IJ SOLN
INTRAMUSCULAR | Status: DC | PRN
  Administered 2018-03-18: 10 mg via INTRAVENOUS

## 2018-03-18 MED ORDER — ACETAMINOPHEN NICU IV SYRINGE 10 MG/ML
INTRAVENOUS | Status: AC
  Filled 2018-03-18: qty 1

## 2018-03-18 MED ORDER — ONDANSETRON HCL 4 MG/2ML IJ SOLN
4.0000 mg | Freq: Four times a day (QID) | INTRAMUSCULAR | Status: DC | PRN
  Administered 2018-03-18: 4 mg via INTRAVENOUS
  Filled 2018-03-18: qty 2

## 2018-03-18 MED ORDER — FENTANYL CITRATE (PF) 100 MCG/2ML IJ SOLN
25.0000 ug | INTRAMUSCULAR | Status: AC | PRN
  Administered 2018-03-18 (×6): 25 ug via INTRAVENOUS

## 2018-03-18 MED ORDER — SUGAMMADEX SODIUM 200 MG/2ML IV SOLN
INTRAVENOUS | Status: DC | PRN
  Administered 2018-03-18: 180 mg via INTRAVENOUS

## 2018-03-18 SURGICAL SUPPLY — 48 items
ADH SKN CLS APL DERMABOND .7 (GAUZE/BANDAGES/DRESSINGS) ×2
BAG URINE DRAINAGE (UROLOGICAL SUPPLIES) ×3 IMPLANT
BLADE SURG SZ11 CARB STEEL (BLADE) ×3 IMPLANT
CATH FOLEY 2WAY  5CC 16FR (CATHETERS) ×2
CATH FOLEY 2WAY 5CC 16FR (CATHETERS) ×1
CATH URTH 16FR FL 2W BLN LF (CATHETERS) ×1 IMPLANT
CHLORAPREP W/TINT 26ML (MISCELLANEOUS) ×3 IMPLANT
CORD MONOPOLAR M/FML 12FT (MISCELLANEOUS) ×3 IMPLANT
DERMABOND ADVANCED (GAUZE/BANDAGES/DRESSINGS) ×4
DERMABOND ADVANCED .7 DNX12 (GAUZE/BANDAGES/DRESSINGS) ×1 IMPLANT
DRSG TEGADERM 2-3/8X2-3/4 SM (GAUZE/BANDAGES/DRESSINGS) ×9 IMPLANT
ELECT REM PT RETURN 9FT ADLT (ELECTROSURGICAL) ×3
ELECTRODE REM PT RTRN 9FT ADLT (ELECTROSURGICAL) ×1 IMPLANT
GAUZE PETRO XEROFOAM 1X8 (MISCELLANEOUS) ×1 IMPLANT
GLOVE BIO SURGEON STRL SZ 6.5 (GLOVE) ×4 IMPLANT
GLOVE BIO SURGEON STRL SZ8 (GLOVE) ×15 IMPLANT
GLOVE BIO SURGEONS STRL SZ 6.5 (GLOVE) ×2
GLOVE INDICATOR 7.0 STRL GRN (GLOVE) ×3 IMPLANT
GOWN STRL REUS W/ TWL LRG LVL3 (GOWN DISPOSABLE) ×2 IMPLANT
GOWN STRL REUS W/ TWL XL LVL3 (GOWN DISPOSABLE) ×1 IMPLANT
GOWN STRL REUS W/TWL LRG LVL3 (GOWN DISPOSABLE) ×6
GOWN STRL REUS W/TWL XL LVL3 (GOWN DISPOSABLE) ×3
IRRIGATION STRYKERFLOW (MISCELLANEOUS) ×1 IMPLANT
IRRIGATOR STRYKERFLOW (MISCELLANEOUS) ×3
IV LACTATED RINGERS 1000ML (IV SOLUTION) ×3 IMPLANT
KIT PINK PAD W/HEAD ARE REST (MISCELLANEOUS) ×3
KIT PINK PAD W/HEAD ARM REST (MISCELLANEOUS) ×1 IMPLANT
KIT TURNOVER CYSTO (KITS) ×3 IMPLANT
LABEL OR SOLS (LABEL) ×3 IMPLANT
PACK BASIN MINOR ARMC (MISCELLANEOUS) ×3 IMPLANT
PACK GYN LAPAROSCOPIC (MISCELLANEOUS) ×3 IMPLANT
PAD OB MATERNITY 4.3X12.25 (PERSONAL CARE ITEMS) ×3 IMPLANT
PENCIL ELECTRO HAND CTR (MISCELLANEOUS) ×3 IMPLANT
SCISSORS METZENBAUM CVD 33 (INSTRUMENTS) IMPLANT
SHEARS HARMONIC ACE PLUS 36CM (ENDOMECHANICALS) ×3 IMPLANT
SLEEVE ENDOPATH XCEL 5M (ENDOMECHANICALS) ×6 IMPLANT
SUT CHROMIC 2 0 CT 1 (SUTURE) ×6 IMPLANT
SUT MNCRL 4-0 (SUTURE) ×3
SUT MNCRL 4-0 27XMFL (SUTURE) ×1
SUT VIC AB 0 CT1 27 (SUTURE) ×6
SUT VIC AB 0 CT1 27XCR 8 STRN (SUTURE) IMPLANT
SUT VIC AB 0 CT1 36 (SUTURE) ×3 IMPLANT
SUT VIC AB 2-0 CT1 (SUTURE) ×4 IMPLANT
SUTURE MNCRL 4-0 27XMF (SUTURE) ×1 IMPLANT
SYR 10ML LL (SYRINGE) ×3 IMPLANT
TAPE TRANSPORE STRL 2 31045 (GAUZE/BANDAGES/DRESSINGS) ×3 IMPLANT
TROCAR XCEL NON-BLD 5MMX100MML (ENDOMECHANICALS) ×3 IMPLANT
TUBING INSUF HEATED (TUBING) ×3 IMPLANT

## 2018-03-18 NOTE — Anesthesia Post-op Follow-up Note (Signed)
Anesthesia QCDR form completed.        

## 2018-03-18 NOTE — Anesthesia Preprocedure Evaluation (Signed)
Anesthesia Evaluation  Patient identified by MRN, date of birth, ID band Patient awake    Reviewed: Allergy & Precautions, NPO status , Patient's Chart, lab work & pertinent test results  Airway Mallampati: II  TM Distance: >3 FB     Dental   Pulmonary asthma ,    Pulmonary exam normal        Cardiovascular negative cardio ROS Normal cardiovascular exam     Neuro/Psych negative neurological ROS  negative psych ROS   GI/Hepatic negative GI ROS, Neg liver ROS,   Endo/Other  negative endocrine ROS  Renal/GU negative Renal ROS  Female GU complaint     Musculoskeletal negative musculoskeletal ROS (+)   Abdominal Normal abdominal exam  (+)   Peds negative pediatric ROS (+)  Hematology negative hematology ROS (+)   Anesthesia Other Findings Past Medical History: No date: Asthma     Comment:  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 No date: Constipation No date: Dysplasia of cervix, low grade (CIN 1) No date: Environmental allergies  Reproductive/Obstetrics                             Anesthesia Physical Anesthesia Plan  ASA: II  Anesthesia Plan: General   Post-op Pain Management:    Induction: Intravenous  PONV Risk Score and Plan:   Airway Management Planned: Oral ETT  Additional Equipment:   Intra-op Plan:   Post-operative Plan: Extubation in OR  Informed Consent: I have reviewed the patients History and Physical, chart, labs and discussed the procedure including the risks, benefits and alternatives for the proposed anesthesia with the patient or authorized representative who has indicated his/her understanding and acceptance.   Dental advisory given  Plan Discussed with: CRNA and Surgeon  Anesthesia Plan Comments:         Anesthesia Quick Evaluation

## 2018-03-18 NOTE — Transfer of Care (Signed)
Immediate Anesthesia Transfer of Care Note  Patient: [name]  Procedure(s) Performed: LAPAROSCOPIC ASSISTED VAGINAL HYSTERECTOMY WITH BILATERAL SALPINGO OOPHORECTOMY (Bilateral )  Patient Location: PACU  Anesthesia Type:General  Level of Consciousness: drowsy and patient cooperative  Airway & Oxygen Therapy: Patient Spontanous Breathing and Patient connected to face mask oxygen  Post-op Assessment: Report given to RN and Post -op Vital signs reviewed and stable  Post vital signs: Reviewed and stable  Last Vitals:  Vitals Value Taken Time  BP 126/78 03/18/2018  4:28 PM  Temp    Pulse 79 03/18/2018  4:29 PM  Resp 16 03/18/2018  4:28 PM  SpO2 100 % 03/18/2018  4:29 PM  Vitals shown include unvalidated device data.  Last Pain:  Vitals:   03/18/18 1148  TempSrc: Tympanic  PainSc: 0-No pain         Complications: No apparent anesthesia complications

## 2018-03-18 NOTE — Op Note (Signed)
OPERATIVE NOTE:  [name] PROCEDURE DATE: 03/18/2018   PREOPERATIVE DIAGNOSIS:  1. Menorrhagia  2. Dysmenorrhea   POSTOPERATIVE DIAGNOSIS:  1. Menorrhagia 2. Dysmenorrhea  PROCEDURE: LAVHBSO  SURGEON:  Brayton Mars, MD ASSISTANTS: Jeannie Fend, MD ANESTHESIA: General INDICATIONS: 52 y.o. [phone] with long history of menorrhagia and dysmenorrhea refractory to medical therapy, presents for definitive surgery.  LAVH BSO is desired.  FINDINGS:  Normal Uterus; Rt ovarian Cyst, simple; scarred LUS   I/O's: Total I/O In: -  Out: 250 [Blood:250] COUNTS:  YES SPECIMENS: Uterus with Cervix and Bilateral fallopian tubes and ovaries ANTIBIOTIC PROPHYLAXIS:Ancef 2 grams COMPLICATIONS: None immediate  PROCEDURE IN DETAIL: Patient brought to the operating room placed in supine position.  General anesthesia is induced without difficulty.  Is placed in the dorsolithotomy position using the bumblebee stirrups.  A ChloraPrep and Hibiclens abdominal perineal and intravaginal prep and drape was performed in standard fashion. Timeout is completed. Foley catheter was placed and is draining clear yellow urine.  I will contact me as placed under the service to facilitate uterine manipulation intraoperatively. Subumbilical vertical incision 5 mm in length is made.  The Optiview laparoscopic trocar system was used to place a 5 mm port directly into the abdominopelvic cavity without evidence of bowel or vascular injury.  2 other 5 mm ports were placed in the right and left lower quadrants respectively under direct visualization.  Hysterectomy was then performed in standard fashion  Using the Ace harmonic scalpel, graspers, and Kleppinger bipolar forceps.  The infundibular pelvic ligament is grasped coagulated and cut with the stomach scalpel.  The mesosalpinx is grasped coagulated and cut sequentially with the harmonic scalpel.  The round ligament is clamped coagulated and cut.  The coronal  broad ligament complexes are taken down with harmonic scalpel; skeletonizing uterine vessels is performed.  Bladder flap is created over the lower uterine segment through sharp and blunt dissection.  Significant scarring is encountered with the bladder flap.   Similar procedure was carried the contralateral side.  Once all pedicles were taken down laparoscopically, the approach was modified to the vagina. Weighted speculum was placed was placed in to the vagina.  Posterior colpotomy was made with Mayo scissors.  Uterosacral ligaments were clamped cut and stick tied using 0 Vicryl suture.  Cervix is circumscribed with the Bovie cautery and the bladder is dissected off the lower uterine segment with sharp and blunt dissection.  Eventually the anterior cul-de-sac was entered.  Remainder of the cardinal broad ligament complexes is taken down with the clamping, cutting, and suture ligating technique.  Specimen is removed from the operative field.  The posterior vaginal cuff is run with 0 Vicryl suture in a running locking baseball with simple interrupted stitch manner.  Vagina is closed with simple interrupted sutures of 2-0 chromic.  Following completion of the vaginal aspect of the procedure, repeat laparoscopy was performed to assess the vascular pedicles.  Pneumoperitoneum was re-created.  The laparoscope verified that all vascular pedicles were hemostatic.  Procedure was then terminated with all instrumentation being removed from the abdomen.  Pneumoperitoneum was released.  The incisions are closed with 4-0 Vicryl suture in a simple interrupted manner.  The incisions are covered with Dermabond glue.  The patient is then awakened and  Sent to recovery in satisfactory condition.    Martin A. Zipporah Plants, MD, A[facility]

## 2018-03-18 NOTE — Anesthesia Procedure Notes (Signed)
Procedure Name: Intubation Date/Time: 03/18/2018 2:02 PM Performed by: Jonna Clark, CRNA Pre-anesthesia Checklist: Patient identified, Patient being monitored, Timeout performed, Emergency Drugs available and Suction available Patient Re-evaluated:Patient Re-evaluated prior to induction Oxygen Delivery Method: Circle system utilized Preoxygenation: Pre-oxygenation with 100% oxygen Induction Type: IV induction Ventilation: Mask ventilation without difficulty Laryngoscope Size: Miller and 2 Grade View: Grade I Tube type: Oral Tube size: 7.0 mm Number of attempts: 1 Airway Equipment and Method: Stylet Placement Confirmation: ETT inserted through vocal cords under direct vision,  positive ETCO2 and breath sounds checked- equal and bilateral Secured at: 21 cm Tube secured with: Tape Dental Injury: Teeth and Oropharynx as per pre-operative assessment

## 2018-03-18 NOTE — Interval H&P Note (Signed)
History and Physical Interval Note:  03/18/2018 1:01 PM  Army Fossa  has presented today for surgery, with the diagnosis of MENORRHAGIA, F/H OF COLON CANCER, F/H OF UTERINE CANCER  The various methods of treatment have been discussed with the patient and family. After consideration of risks, benefits and other options for treatment, the patient has consented to  Procedure(s): [place] (Bilateral) as a surgical intervention .  The patient's history has been reviewed, patient examined, no change in status, stable for surgery.  I have reviewed the patient's chart and labs.  Questions were answered to the patient's satisfaction.     Hassell Done A Lynde Ludwig

## 2018-03-19 ENCOUNTER — Encounter: Payer: Self-pay | Admitting: Obstetrics and Gynecology

## 2018-03-19 DIAGNOSIS — N921 Excessive and frequent menstruation with irregular cycle: Secondary | ICD-10-CM | POA: Diagnosis not present

## 2018-03-19 LAB — HEMOGLOBIN: HEMOGLOBIN: 11.8 g/dL — AB (ref 12.0–16.0)

## 2018-03-19 LAB — RPR: RPR Ser Ql: NONREACTIVE

## 2018-03-19 MED ORDER — IBUPROFEN 600 MG PO TABS: 600.0000 mg | ORAL_TABLET | Freq: Four times a day (QID) | ORAL | 0 refills | Status: DC

## 2018-03-19 MED ORDER — ACETAMINOPHEN 500 MG PO TABS: 1000.0000 mg | ORAL_TABLET | Freq: Four times a day (QID) | ORAL | 0 refills | Status: DC

## 2018-03-19 MED ORDER — IBUPROFEN 600 MG PO TABS: 600.0000 mg | ORAL_TABLET | Freq: Four times a day (QID) | ORAL | Status: DC

## 2018-03-19 MED ORDER — HYDROMORPHONE HCL 2 MG PO TABS: 2.0000 mg | ORAL_TABLET | ORAL | Status: DC | PRN

## 2018-03-19 MED ORDER — ACETAMINOPHEN 500 MG PO TABS
1000.0000 mg | ORAL_TABLET | Freq: Four times a day (QID) | ORAL | Status: DC
  Administered 2018-03-19: 1000 mg via ORAL
  Filled 2018-03-19: qty 2

## 2018-03-19 MED ORDER — HYDROMORPHONE HCL 2 MG PO TABS: 2.0000 mg | ORAL_TABLET | ORAL | 0 refills | Status: DC | PRN

## 2018-03-19 NOTE — Progress Notes (Signed)
Patient discharged home. Discharge instructions, prescriptions and follow up appointment given to and reviewed with patient. Patient verbalized understanding.  Patient waiting on ride

## 2018-03-19 NOTE — Discharge Instructions (Signed)

## 2018-03-19 NOTE — Anesthesia Postprocedure Evaluation (Signed)
Anesthesia Post Note  Patient: [name]  Procedure(s) Performed: LAPAROSCOPIC ASSISTED VAGINAL HYSTERECTOMY WITH BILATERAL SALPINGO OOPHORECTOMY (Bilateral )  Patient location during evaluation: PACU Anesthesia Type: General Level of consciousness: awake and alert and oriented Pain management: pain level controlled Vital Signs Assessment: post-procedure vital signs reviewed and stable Respiratory status: spontaneous breathing Cardiovascular status: blood pressure returned to baseline Anesthetic complications: no     Last Vitals:  Vitals:   03/19/18 0318 03/19/18 0857  BP: 138/78 129/83  Pulse: (!) 107 94  Resp: 19 20  Temp: 37.3 C 36.9 C  SpO2: 97% 97%    Last Pain:  Vitals:   03/19/18 0857  TempSrc: Oral  PainSc:                  Taffany Heiser

## 2018-03-19 NOTE — Discharge Summary (Signed)
Physician Discharge Summary  Patient ID: [name] MRN: [id number] DOB/AGE: 50-03-1966 50 y.o.  Admit date: 03/18/2018 Discharge date: 03/19/2018  Admission Diagnoses: 1.  Menorrhagia 2.  Dysmenorrhea  Discharge Diagnoses:  1.  Menorrhagia 2.Dysmenorrhea  Operative Procedures: Procedure(s): LAPAROSCOPIC ASSISTED VAGINAL HYSTERECTOMY WITH BILATERAL SALPINGO OOPHORECTOMY (Bilateral)  Hospital Course: Uncomplicated.   Significant Diagnostic Studies:  Lab Results  Component Value Date   HGB 11.8 (L) 03/19/2018   HGB 12.4 03/18/2018   Lab Results  Component Value Date   HCT 36.7 03/18/2018   CBC Latest Ref Rng & Units 03/19/2018 03/18/2018  WBC 3.6 - 11.0 K/uL - 7.4  Hemoglobin 12.0 - 16.0 g/dL 11.8(L) 12.4  Hematocrit 35.0 - 47.0 % - 36.7  Platelets 150 - 440 K/uL - 259     Discharged Condition: good  Discharge Exam: Blood pressure 138/78, pulse (!) 107, temperature 99.1 F (37.3 C), temperature source Oral, resp. rate 19, height 5\' 7"  (1.702 m), weight 199 lb (90.3 kg), last menstrual period 02/11/2018, SpO2 97 %. Incision/Wound: dry and no drainage  Disposition: Discharge disposition: 01-Home or Self Care       Discharge Instructions    Discharge patient   Complete by:  As directed    Discharge disposition:  01-Home or Self Care   Discharge patient date:  03/19/2018     Allergies as of 03/19/2018      Reactions   Sulfamethoxazole-trimethoprim Rash      Medication List    TAKE these medications   acetaminophen 500 MG tablet Commonly known as:  TYLENOL Take 2 tablets (1,000 mg total) by mouth 4 (four) times daily.   albuterol 108 (90 Base) MCG/ACT inhaler Commonly known as:  PROVENTIL HFA;VENTOLIN HFA Inhale 2 puffs into the lungs every 6 (six) hours as needed for wheezing or shortness of breath.   HYDROmorphone 2 MG tablet Commonly known as:  DILAUDID Take 1 tablet (2 mg total) by mouth every 4 (four) hours as needed for moderate pain or  severe pain.   ibuprofen 600 MG tablet Commonly known as:  ADVIL,MOTRIN Take 1 tablet (600 mg total) by mouth 4 (four) times daily.   montelukast 10 MG tablet Commonly known as:  SINGULAIR Take 1 tablet (10 mg total) by mouth at bedtime.      Follow-up Information    [name], Janet Slim, MD. Go in 1 week(s).   Specialties:  Obstetrics and Gynecology, Radiology Why:  Post Op check Contact information: [address] [place] [address] [phone]           Signed: Alanda Slim Niala Quinn 03/19/2018, 7:53 AM

## 2018-03-19 NOTE — Progress Notes (Signed)
Patient wheeled out by auxiliary.  

## 2018-03-20 LAB — SURGICAL PATHOLOGY

## 2018-03-20 LAB — TYPE AND SCREEN
ABO/RH(D): O NEG
Antibody Screen: POSITIVE
UNIT DIVISION: 0
Unit division: 0

## 2018-03-20 LAB — BPAM RBC
BLOOD PRODUCT EXPIRATION DATE: 201907262359
Blood Product Expiration Date: 201907262359
UNIT TYPE AND RH: 9500
Unit Type and Rh: 9500

## 2018-03-22 ENCOUNTER — Telehealth: Payer: Self-pay | Admitting: Obstetrics and Gynecology

## 2018-03-22 NOTE — Telephone Encounter (Signed)
Patient called stating she is 4 days post hysterectomy. She would like to know when she will discuss hormone replacement therapy with Dr D. Please Advise.

## 2018-03-22 NOTE — Telephone Encounter (Signed)
Pt states she feels heavy and just not herself. May want hrt.  Pain is controlled. Pt f/u next week with mad.

## 2018-03-27 ENCOUNTER — Ambulatory Visit (INDEPENDENT_AMBULATORY_CARE_PROVIDER_SITE_OTHER): Payer: BC Managed Care – PPO | Admitting: Obstetrics and Gynecology

## 2018-03-27 ENCOUNTER — Encounter: Payer: Self-pay | Admitting: Obstetrics and Gynecology

## 2018-03-27 VITALS — BP 117/75 | HR 80 | Ht 67.0 in | Wt 199.0 lb

## 2018-03-27 DIAGNOSIS — Z90710 Acquired absence of both cervix and uterus: Secondary | ICD-10-CM

## 2018-03-27 DIAGNOSIS — Z09 Encounter for follow-up examination after completed treatment for conditions other than malignant neoplasm: Secondary | ICD-10-CM

## 2018-03-27 NOTE — Patient Instructions (Signed)
1.  Gradually increase regular activities as tolerated 2.  Return in 4 weeks for final postop check

## 2018-03-27 NOTE — Progress Notes (Signed)
Chief complaint: 1.  1 week postop check 2.  Status post LAVH BSO for uncontrolled menorrhagia and dysmenorrhea  Since surgery patient has noted resolution of her pelvic pain.  She is not taking any analgesics at this time.  Bowel function is normal.  Bladder function is normal.  She has not noted any significant vasomotor symptoms.   Surgical pathology: DIAGNOSIS:  A. UTERUS WITH CERVIX; HYSTERECTOMY:  - CERVIX WITHOUT PATHOLOGIC CHANGES.  - DISORDERED PROLIFERATIVE ENDOMETRIUM.  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   BILATERAL OVARIES; OOPHORECTOMY:  - FOLLICULAR CYSTS, RIGHT OVARY.  - LEFT OVARY WITHOUT PATHOLOGIC CHANGES.   BILATERAL FALLOPIAN TUBES; SALPINGECTOMY:  - NO PATHOLOGIC CHANGES.   OBJECTIVE:  BP 117/75   Pulse 80   Ht 5\' 7"  (1.702 m)   Wt 199 lb (90.3 kg)   LMP 02/11/2018 (Exact Date)   BMI 31.17 kg/m  Pleasant female no acute distress.  Alert and oriented. Abdomen: Soft, nontender; laparoscopy incisions are well approximated without evidence of hernia; right lower quadrant port site has minimal erythema possibly related to suture granuloma Extremities: None tender and without edema  ASSESSMENT: 1.  Normal postop check status post LAVH BSO 10 days ago 2.  Pathology notable for fibroids  PLAN: 1.  Gradually increase activities as tolerated 2.  Return 4 weeks for final postop check  Brayton Mars, MD  Note: This dictation was prepared with Dragon dictation along with smaller phrase technology. Any transcriptional errors that result from this process are unintentional.

## 2018-04-02 ENCOUNTER — Encounter: Payer: BC Managed Care – PPO | Admitting: Obstetrics and Gynecology

## 2018-04-11 ENCOUNTER — Encounter: Payer: BC Managed Care – PPO | Admitting: Family Medicine

## 2018-04-15 ENCOUNTER — Encounter: Payer: BC Managed Care – PPO | Admitting: Family Medicine

## 2018-04-16 ENCOUNTER — Ambulatory Visit (INDEPENDENT_AMBULATORY_CARE_PROVIDER_SITE_OTHER): Payer: BC Managed Care – PPO | Admitting: *Deleted

## 2018-04-16 DIAGNOSIS — Z0289 Encounter for other administrative examinations: Secondary | ICD-10-CM

## 2018-04-16 DIAGNOSIS — Z111 Encounter for screening for respiratory tuberculosis: Secondary | ICD-10-CM | POA: Diagnosis not present

## 2018-04-18 ENCOUNTER — Ambulatory Visit (INDEPENDENT_AMBULATORY_CARE_PROVIDER_SITE_OTHER): Payer: BC Managed Care – PPO | Admitting: Obstetrics and Gynecology

## 2018-04-18 ENCOUNTER — Encounter: Payer: Self-pay | Admitting: Family Medicine

## 2018-04-18 ENCOUNTER — Ambulatory Visit (INDEPENDENT_AMBULATORY_CARE_PROVIDER_SITE_OTHER): Payer: BC Managed Care – PPO | Admitting: Family Medicine

## 2018-04-18 ENCOUNTER — Encounter: Payer: Self-pay | Admitting: Obstetrics and Gynecology

## 2018-04-18 VITALS — BP 117/73 | HR 89 | Ht 67.0 in | Wt 202.8 lb

## 2018-04-18 VITALS — BP 116/80 | HR 81 | Temp 98.4°F | Ht 66.5 in | Wt 202.5 lb

## 2018-04-18 DIAGNOSIS — Z09 Encounter for follow-up examination after completed treatment for conditions other than malignant neoplasm: Secondary | ICD-10-CM

## 2018-04-18 DIAGNOSIS — Z800 Family history of malignant neoplasm of digestive organs: Secondary | ICD-10-CM

## 2018-04-18 DIAGNOSIS — E8941 Symptomatic postprocedural ovarian failure: Secondary | ICD-10-CM

## 2018-04-18 DIAGNOSIS — Z029 Encounter for administrative examinations, unspecified: Secondary | ICD-10-CM | POA: Diagnosis not present

## 2018-04-18 DIAGNOSIS — Z23 Encounter for immunization: Secondary | ICD-10-CM | POA: Diagnosis not present

## 2018-04-18 DIAGNOSIS — Z111 Encounter for screening for respiratory tuberculosis: Secondary | ICD-10-CM | POA: Diagnosis not present

## 2018-04-18 DIAGNOSIS — Z90710 Acquired absence of both cervix and uterus: Secondary | ICD-10-CM

## 2018-04-18 LAB — TB SKIN TEST
Induration: 0 mm
TB SKIN TEST: NEGATIVE

## 2018-04-18 MED ORDER — ESTRADIOL 1 MG PO TABS: 1.0000 mg | ORAL_TABLET | Freq: Every day | ORAL | 12 refills | Status: DC

## 2018-04-18 NOTE — Progress Notes (Signed)
TB neg on L forearm.  Zero induration.  D/w pt.    HRT per gyn.  She is considering options about treatment.  I'll defer.   She had post op check today and feels good o/w, except for hot flashes.    FH colon cancer, mother.  D/w pt.  Her mother starts chemo tomorrow.  Referral to GI placed.    tdap today.    See avs re: mammogram.   Meds, vitals, and allergies reviewed.   ROS: Per HPI unless specifically indicated in ROS section   GEN: nad, alert and oriented HEENT: mucous membranes moist NECK: supple w/o LA CV: rrr. PULM: ctab, no inc wob ABD: soft, +bs EXT: no edema SKIN: no acute rash TB screen with 0 induration on left forearm.

## 2018-04-18 NOTE — Patient Instructions (Addendum)
We will call about your referral.  Rosaria Ferries or Azalee Course will call you if you don't see one of them on the way out.  You can call for a mammogram at [facility] at [facility].  [place]  Glad to see you.

## 2018-04-18 NOTE — Patient Instructions (Signed)
1.  Resume activities as tolerated 2.  Begin estradiol 1 mg a day 3.  Begin calcium supplementation 600 mg twice a day and vitamin D 400 international units twice a day (an alternative would be Tums 6 tablets a day and vitamin D 1000 international units daily) 4.  Continue with weightbearing exercises to help minimize osteoporosis development 5.  Return in 6 weeks for follow-up  Estradiol tablets What is this medicine? ESTRADIOL (es tra DYE ole) is an estrogen. It is mostly used as hormone replacement in menopausal women. It helps to treat hot flashes and prevent osteoporosis. It is also used to treat women with low estrogen levels or those who have had their ovaries removed. This medicine may be used for other purposes; ask your health care provider or pharmacist if you have questions. COMMON BRAND NAME(S): Estrace, Femtrace, Gynodiol What should I tell my health care provider before I take this medicine? They need to know if you have or ever had any of these conditions: -abnormal vaginal bleeding -blood vessel disease or blood clots -breast, cervical, endometrial, ovarian, liver, or uterine cancer -dementia -diabetes -gallbladder disease -heart disease or recent heart attack -high blood pressure -high cholesterol -high level of calcium in the blood -hysterectomy -kidney disease -liver disease -migraine headaches -protein C deficiency -protein S deficiency -stroke -systemic lupus erythematosus (SLE) -tobacco smoker -an unusual or allergic reaction to estrogens, other hormones, medicines, foods, dyes, or preservatives -pregnant or trying to get pregnant -breast-feeding How should I use this medicine? Take this medicine by mouth. To reduce nausea, this medicine may be taken with food. Follow the directions on the prescription label. Take this medicine at the same time each day and in the order directed on the package. Do not take your medicine more often than directed. Contact  your pediatrician regarding the use of this medicine in children. Special care may be needed. A patient package insert for the product will be given with each prescription and refill. Read this sheet carefully each time. The sheet may change frequently. Overdosage: If you think you have taken too much of this medicine contact a poison control center or emergency room at once. NOTE: This medicine is only for you. Do not share this medicine with others. What if I miss a dose? If you miss a dose, take it as soon as you can. If it is almost time for your next dose, take only that dose. Do not take double or extra doses. What may interact with this medicine? Do not take this medicine with any of the following medications: -aromatase inhibitors like aminoglutethimide, anastrozole, exemestane, letrozole, testolactone This medicine may also interact with the following medications: -carbamazepine -certain antibiotics used to treat infections -certain barbiturates or benzodiazepines used for inducing sleep or treating seizures -grapefruit juice -medicines for fungus infections like itraconazole and ketoconazole -raloxifene or tamoxifen -rifabutin, rifampin, or rifapentine -ritonavir -St. John's Wort -warfarin This list may not describe all possible interactions. Give your health care provider a list of all the medicines, herbs, non-prescription drugs, or dietary supplements you use. Also tell them if you smoke, drink alcohol, or use illegal drugs. Some items may interact with your medicine. What should I watch for while using this medicine? Visit your doctor or health care professional for regular checks on your progress. You will need a regular breast and pelvic exam and Pap smear while on this medicine. You should also discuss the need for regular mammograms with your health care professional, and follow his or  her guidelines for these tests. This medicine can make your body retain fluid, making your  fingers, hands, or ankles swell. Your blood pressure can go up. Contact your doctor or health care professional if you feel you are retaining fluid. If you have any reason to think you are pregnant, stop taking this medicine right away and contact your doctor or health care professional. Smoking increases the risk of getting a blood clot or having a stroke while you are taking this medicine, especially if you are more than 50 years old. You are strongly advised not to smoke. If you wear contact lenses and notice visual changes, or if the lenses begin to feel uncomfortable, consult your eye doctor or health care professional. This medicine can increase the risk of developing a condition (endometrial hyperplasia) that may lead to cancer of the lining of the uterus. Taking progestins, another hormone drug, with this medicine lowers the risk of developing this condition. Therefore, if your uterus has not been removed (by a hysterectomy), your doctor may prescribe a progestin for you to take together with your estrogen. You should know, however, that taking estrogens with progestins may have additional health risks. You should discuss the use of estrogens and progestins with your health care professional to determine the benefits and risks for you. If you are going to have surgery, you may need to stop taking this medicine. Consult your health care professional for advice before you schedule the surgery. What side effects may I notice from receiving this medicine? Side effects that you should report to your doctor or health care professional as soon as possible: -allergic reactions like skin rash, itching or hives, swelling of the face, lips, or tongue -breast tissue changes or discharge -changes in vision -chest pain -confusion, trouble speaking or understanding -dark urine -general ill feeling or flu-like symptoms -light-colored stools -nausea, vomiting -pain, swelling, warmth in the leg -right upper  belly pain -severe headaches -shortness of breath -sudden numbness or weakness of the face, arm or leg -trouble walking, dizziness, loss of balance or coordination -unusual vaginal bleeding -yellowing of the eyes or skin Side effects that usually do not require medical attention (report to your doctor or health care professional if they continue or are bothersome): -hair loss -increased hunger or thirst -increased urination -symptoms of vaginal infection like itching, irritation or unusual discharge -unusually weak or tired This list may not describe all possible side effects. Call your doctor for medical advice about side effects. You may report side effects to FDA at 1-800-FDA-1088. Where should I keep my medicine? Keep out of the reach of children. Store at room temperature between 20 and 25 degrees C (68 and 77 degrees F). Keep container tightly closed. Protect from light. Throw away any unused medicine after the expiration date. NOTE: This sheet is a summary. It may not cover all possible information. If you have questions about this medicine, talk to your doctor, pharmacist, or health care provider.  2018 Elsevier/Gold Standard (2010-12-14 86:76:19)

## 2018-04-18 NOTE — Progress Notes (Signed)
Pt stated that she is doing well no complaints.  Chief complaint: 1.  Final postop check 2.  Status post LAVH BSO (03/18/2018)  Patient reports feeling well.  Bowel function is normal.  Bladder function is normal.  She is not experiencing any vaginal dryness or bleeding.  She has not resumed intercourse as of yet. Significant issue today is severe hot flashes and night sweats.  She is going to consider ERT.  Pathology: Surgical pathology: DIAGNOSIS:  A. UTERUS WITH CERVIX; HYSTERECTOMY:  - CERVIX WITHOUT PATHOLOGIC CHANGES.  - DISORDERED PROLIFERATIVE ENDOMETRIUM.  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   BILATERAL OVARIES; OOPHORECTOMY:  - FOLLICULAR CYSTS, RIGHT OVARY.  - LEFT OVARY WITHOUT PATHOLOGIC CHANGES.   BILATERAL FALLOPIAN TUBES; SALPINGECTOMY:  - NO PATHOLOGIC CHANGES  OBJECTIVE: BP 117/73   Pulse 89   Ht 5\' 7"  (1.702 m)   Wt 202 lb 12.8 oz (92 kg)   LMP 02/11/2018 (Exact Date)   BMI 31.76 kg/m  Pleasant well-appearing female no acute distress.  Alert and oriented. Abdomen: Soft, nontender without organomegaly Bladder: Nontender Pelvic: External genitalia-normal BUS-normal Vagina-fair estrogen effect; vaginal cuff is intact with minimal area of spotting with residual suture; bimanual reveals no induration tenderness or mass Cervix-surgically absent Rocephin surgically absent Adnexa-surgically absent; bimanual exam nonpalpable nontender Rectovaginal-normal external exam Extremities-warm and dry  ASSESSMENT: 1.  Normal final postop check 2.  Status post LAVH BSO (03/18/2018 3.  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  Vaginal cuff with minimal residual suture and granulation  PLAN: 1.  Resume activities as tolerated.  May consider resuming intercourse in 1 week 2.  Begin estradiol 1 mg a day for ERT therapy 3.  Pros and cons of ERT therapy were discussed 4.  Begin calcium supplementation with vitamin D daily  (recommend Tums 6 a day and vitamin D 1000 international units daily) 5.  Increase weightbearing exercise 6.  Return in 6 weeks for follow-up on vasomotor symptoms  Brayton Mars, MD  Note: This dictation was prepared with Dragon dictation along with smaller phrase technology. Any transcriptional errors that result from this process are unintentional.

## 2018-04-21 DIAGNOSIS — Z029 Encounter for administrative examinations, unspecified: Secondary | ICD-10-CM | POA: Insufficient documentation

## 2018-04-21 NOTE — Assessment & Plan Note (Signed)
Form done for work.  TB screen negative.  Refer to GI given family history of colon cancer.  Tdap updated.  Healthy habits encouraged.  I will defer to gynecology about hormone replacement therapy.  Patient agrees.

## 2018-05-07 ENCOUNTER — Encounter: Payer: BC Managed Care – PPO | Admitting: Obstetrics and Gynecology

## 2018-07-25 ENCOUNTER — Encounter: Payer: Self-pay | Admitting: Family Medicine

## 2018-07-29 ENCOUNTER — Encounter: Payer: Self-pay | Admitting: *Deleted

## 2018-08-24 ENCOUNTER — Other Ambulatory Visit: Payer: Self-pay | Admitting: Family Medicine

## 2018-08-26 NOTE — Telephone Encounter (Signed)
Electronic refill request Singulair Last office visit 04/18/18 Medication no longer on med list

## 2018-08-27 NOTE — Telephone Encounter (Signed)
Please verify current use/need with patient. Thanks.

## 2018-08-27 NOTE — Telephone Encounter (Signed)
Left detailed message on voicemail asking patient to call in with information.

## 2018-08-28 ENCOUNTER — Encounter: Payer: Self-pay | Admitting: Family Medicine

## 2018-08-28 NOTE — Telephone Encounter (Signed)
[facility] faxed note pt returning call 08/27/18 at 6:28 pm. No other message.

## 2018-08-28 NOTE — Telephone Encounter (Signed)
Left message on voicemail for patient to call back during office hours.

## 2018-08-29 ENCOUNTER — Other Ambulatory Visit: Payer: Self-pay | Admitting: Family Medicine

## 2018-08-29 MED ORDER — MONTELUKAST SODIUM 10 MG PO TABS: 10.0000 mg | ORAL_TABLET | Freq: Every day | ORAL | 3 refills | Status: DC

## 2018-08-29 NOTE — Telephone Encounter (Signed)
Received MyChart message asking for the refill.  Message was forwarded to Dr. Damita Dunnings.

## 2018-09-01 NOTE — Telephone Encounter (Signed)
Prescription previously sent in.  Patient previously notified via my chart message.

## 2018-09-03 ENCOUNTER — Encounter: Payer: Self-pay | Admitting: Internal Medicine

## 2018-09-03 ENCOUNTER — Ambulatory Visit: Payer: BC Managed Care – PPO | Admitting: Internal Medicine

## 2018-09-03 VITALS — BP 118/80 | HR 64 | Temp 97.9°F | Wt 204.0 lb

## 2018-09-03 DIAGNOSIS — H00012 Hordeolum externum right lower eyelid: Secondary | ICD-10-CM | POA: Diagnosis not present

## 2018-09-03 DIAGNOSIS — H00014 Hordeolum externum left upper eyelid: Secondary | ICD-10-CM | POA: Diagnosis not present

## 2018-09-03 NOTE — Patient Instructions (Signed)

## 2018-09-03 NOTE — Progress Notes (Signed)
Subjective:    Patient ID: [name], female    DOB: January 02, 1966, 52 y.o.   MRN: [id number]  HPI  Pt presents to the clinic today with c/o right eyelid swelling. She noticed this 3-4. She reports the area is red, warm and tender to touch. Now her left eyelid is red, warm and swollen to touch. She denies runny nose, nasal congestion, ear pain or sore throat. She does have some stiffness in her neck, but not sure if it is related. She has tried warm compresses, OTC Stye relief meds with minimal relief.  Review of Systems      Past Medical History:  Diagnosis Date  . 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 Constipation   . Dysplasia of cervix, low grade (CIN 1)   . Environmental allergies     Current Outpatient Medications  Medication Sig Dispense Refill  . albuterol (PROVENTIL HFA;VENTOLIN HFA) 108 (90 Base) MCG/ACT inhaler Inhale 2 puffs into the lungs every 6 (six) hours as needed for wheezing or shortness of breath.    . montelukast (SINGULAIR) 10 MG tablet Take 1 tablet (10 mg total) by mouth at bedtime. 90 tablet 3  . estradiol (ESTRACE) 1 MG tablet Take 1 tablet (1 mg total) by mouth daily. (Patient not taking: Reported on 09/03/2018) 30 tablet 12   No current facility-administered medications for this visit.     Allergies  Allergen Reactions  . Sulfamethoxazole-Trimethoprim Rash    Family History  Problem Relation Age of Onset  . Heart disease Maternal Grandfather   . Uterine cancer Mother   . Colon cancer Mother   . Breast cancer Neg Hx   . Ovarian cancer Neg Hx   . Diabetes Neg Hx     Social History   Socioeconomic History  . Marital status: Married    Spouse name: Not on file  . Number of children: Not on file  . Years of education: Not on file  . Highest education level: Not on file  Occupational History  . Not on file  Social Needs  . Financial resource strain: Not on file  . Food insecurity:    Worry: Not on file    Inability:  Not on file  . Transportation needs:    Medical: Not on file    Non-medical: Not on file  Tobacco Use  . Smoking status: Never Smoker  . Smokeless tobacco: Never Used  Substance and Sexual Activity  . Alcohol use: Yes    Alcohol/week: 0.0 standard drinks    Comment: rarely  . Drug use: No  . Sexual activity: Yes    Birth control/protection: Surgical  Lifestyle  . Physical activity:    Days per week: Not on file    Minutes per session: Not on file  . Stress: Not on file  Relationships  . Social connections:    Talks on phone: Not on file    Gets together: Not on file    Attends religious service: Not on file    Active member of club or organization: Not on file    Attends meetings of clubs or organizations: Not on file    Relationship status: Not on file  . Intimate partner violence:    Fear of current or ex partner: Not on file    Emotionally abused: Not on file    Physically abused: Not on file    Forced sexual activity: Not on file  Other Topics Concern  . Not  on file  Social History Narrative  . Not on file     Constitutional: Denies fever, malaise, fatigue, headache or abrupt weight changes.  HEENT: Pt reports eyelid swelling. Denies eye pain, eye redness, ear pain, ringing in the ears, wax buildup, runny nose, nasal congestion, bloody nose, or sore throat. Respiratory: Denies difficulty breathing, shortness of breath, cough or sputum production.   Cardiovascular: Denies chest pain, chest tightness, palpitations or swelling in the hands or feet.  Skin: Pt reports redness around eyes. Denies redness, rashes, lesions or ulcercations.   No other specific complaints in a complete review of systems (except as listed in HPI above).  Objective:   Physical Exam   BP 118/80   Pulse 64   Temp 97.9 F (36.6 C) (Oral)   Wt 204 lb (92.5 kg)   LMP 02/11/2018 (Exact Date)   SpO2 97%   BMI 32.43 kg/m  Wt Readings from Last 3 Encounters:  09/03/18 204 lb (92.5 kg)    04/18/18 202 lb 8 oz (91.9 kg)  04/18/18 202 lb 12.8 oz (92 kg)    General: Appears her stated age, well developed, well nourished in NAD. HEENT: Head: normal shape and size; Eyes: sclera white, no icterus, conjunctiva pink, PERRLA and EOMs intact, stye noted of right lower lid, left upper lid; Neck:  No adenopathy noted.        Assessment & Plan:   Stye, Bilateral:  Warm compresses TID If enlarges, may need to be seen by ophthalmology Saline eye drops as needed for comfort Avoid picking eye lashes, rubbing eyes  Return precautions discussed Webb Silversmith, NP

## 2019-01-30 ENCOUNTER — Telehealth: Payer: Self-pay

## 2019-01-30 NOTE — Telephone Encounter (Signed)
Please add to my schedule if needed.  Thanks.

## 2019-01-30 NOTE — Telephone Encounter (Signed)
Left message for patient to call back to be scheduled for UTI symptoms. Can do virtual visit with urine drop off prior for testing. Dr. Einar Pheasant can see patient for virtual visit today if patient calls back before appointments fill up and if patient can bring urine prior to the appointment. If not can schedule with Dr .Damita Dunnings for tomorrow-01/31/2019    [facility] Patient Name: [name] Gender: Female DOB: 1966/02/13  Age: 53 Y 9 M 19 D Return Phone Number: [phone] (Primary) Address:  [place]  [address] [name] - Client Client Site [place] Physician [name] - MD Contact Type Call Who Is Calling Patient / Member / Family / Caregiver Call Type Triage / Clinical Relationship To Patient Self Return Phone Number [phone] (Primary) Chief Complaint Urination Pain Reason for Call Request to Schedule Visit Initial Comment Caller thinks she has a UTI, urination discomfort. Needing medication called in or appt. No other symptoms. Translation No Nurse Assessment Nurse: [name], RN, [name] Date/Time (Eastern Time): 01/30/2019 8:26:10 AM Confirm and document reason for call. If symptomatic, describe symptoms. ---Caller thinks she has a UTI, lower pelvic discomfort for 3 days. Denies hematuria, dysuria. BP 136/92 . Denies fever, vomiting and diarrhea. Has the patient had close contact with a person known or suspected to have the novel coronavirus illness OR traveled / lives in area with major community spread (including international travel) in the last 14 days from the onset of symptoms? * If Asymptomatic, screen for exposure and travel within the last 14 days. ---No Does the patient have any new or worsening symptoms? ---Yes Will a triage be completed? ---Yes Related visit to physician within the last 2 weeks? ---No Does the PT have any chronic conditions? (i.e.  diabetes, asthma, this includes High risk factors for pregnancy, etc.) ---Yes List chronic conditions. ---Hysterectomy last year, on estrogen. Is the patient pregnant or possibly pregnant? (Ask all females between the ages of 46-55) ---No Is this a behavioral health or substance abuse call? ---No Guidelines Guideline Title Affirmed Question Affirmed Notes Nurse Date/Time (Eastern Time) Abdominal Pain - Female [1] MILD pain (e.g., does not interfere with normal activities) AND [2] pain comes and goes (cramps)  [name], RN, [name] 01/30/2019 8:29:20 AM PLEASE NOTE:  All timestamps contained within this report are represented as [place] Standard Time. CONFIDENTIALTY NOTICE: This fax transmission is intended only for the addressee.  It contains information that is legally privileged, confidential or otherwise protected from use or disclosure.  If you are not the intended recipient, you are strictly prohibited from reviewing, disclosing, copying using or disseminating any of this information or taking any action in reliance on or regarding this information.  If you have received this fax in error, please notify us immediately by telephone so that we can arrange for its return to [place]. Phone:  [phone], Toll-Free:  [phone], Fax:  [phone] Page: 2 of 2 Call Id: [id number] Guidelines Guideline Title Affirmed Question Affirmed Notes Nurse Date/Time Eilene Ghazi Time) AND [3] present > 48 hours Disp. Time Eilene Ghazi Time) Disposition Final User 01/30/2019 8:32:55 AM See PCP within 24 Hours Yes [name], RN, Julio Sicks Disagree/Comply Comply Caller Understands Yes PreDisposition Call Doctor Care Advice Given Per Guideline SEE PCP WITHIN 24 HOURS: * IF OFFICE WILL BE OPEN: You need to be seen within the next 24 hours. Call your doctor (or NP/PA) when the office opens and make  an appointment. DIET: * Drink adequate fluids. Eat a bland diet. * Avoid alcohol or caffeinated beverages * Avoid  greasy or fatty foods. CALL BACK IF: * Severe pain lasts over 1 hour * Constant pain lasts over 2 hours * You become worse. CARE ADVICE given per Abdominal Pain, Female (Adult) guideline. Referrals REFERRED TO PCP OFFICE

## 2019-01-31 NOTE — Telephone Encounter (Signed)
I spoke with pt; she drank a lot of water and is feeling better today and pt will cb if needed. FYI to Dr Damita Dunnings.

## 2019-02-02 NOTE — Telephone Encounter (Signed)
Noted. Thanks.

## 2019-02-11 ENCOUNTER — Other Ambulatory Visit: Payer: Self-pay | Admitting: Family Medicine

## 2019-03-02 ENCOUNTER — Other Ambulatory Visit: Payer: Self-pay | Admitting: Family Medicine

## 2019-03-03 NOTE — Telephone Encounter (Signed)
Electronic refill request. Albuterol Last office visit:   09/03/18 acute Last Filled:    8.5 Inhaler 0 02/11/2019  Please advise.

## 2019-03-04 ENCOUNTER — Ambulatory Visit (INDEPENDENT_AMBULATORY_CARE_PROVIDER_SITE_OTHER): Payer: BC Managed Care – PPO | Admitting: Family Medicine

## 2019-03-04 ENCOUNTER — Telehealth: Payer: Self-pay

## 2019-03-04 ENCOUNTER — Encounter: Payer: Self-pay | Admitting: Family Medicine

## 2019-03-04 DIAGNOSIS — G43109 Migraine with aura, not intractable, without status migrainosus: Secondary | ICD-10-CM | POA: Diagnosis not present

## 2019-03-04 DIAGNOSIS — I10 Essential (primary) hypertension: Secondary | ICD-10-CM

## 2019-03-04 NOTE — Progress Notes (Signed)
Virtual visit completed through WebEx or similar program Patient location: home  Provider location: Financial controller at [facility], office   Pandemic considerations d/w pt.   Limitations and rationale for visit method d/w patient.  Patient agreed to proceed.   CC: BP elevation.   HPI: Recently got a BP cuff and started checking BP.  Usually 140/90s or higher.  No CP, not SOB.   She has some intermittent vision changes with shimmering noted.  Noted in the last two months.  No vision loss.  Simmering is in B eyes.  She doesn't usually have a HA with the events but did have HA with the most recent episode.  That HA was diffuse, not focal.  Better with tylenol and rest.   Shimmering can last a brief amount of time, up to 15 minutes.   She doesn't have photo or phonophobia.  No other focal neuro changes.  No double vision.    Higher stress noted, her mother died, condolences offered.    Meds and allergies reviewed.   ROS: Per HPI unless specifically indicated in ROS section   NAD Speech wnl Normal eye tracking Normal smile  A/P:  HTN- needs OV and labs prior to starting any medication.  She agrees.  We will get her scheduled.  Presumed migraines.  Needs HRT taper, needs eye clinic f/u, needs OV here.  Take tylenol if needed for episodes, which are happening about 1x/week.  In the meantime I would taper estrace as tolerated. Would try to skip a day of HRT and then gradually dec use from that point on.  She agrees.

## 2019-03-04 NOTE — Telephone Encounter (Signed)
Noted. Thanks. Will see at OV.  

## 2019-03-04 NOTE — Telephone Encounter (Signed)
Sent because I did not want her to run out.  Please see if this is an automatic refill.  If she is needing this frequently then let me know.  Thanks.

## 2019-03-04 NOTE — Telephone Encounter (Signed)
Spoke with patient. She does have 1 inhaler from may refill and should be fine for a few months. Uses it rarely. She just like to have an inhaler on hand in case.

## 2019-03-04 NOTE — Telephone Encounter (Signed)
Accidentally removed Dr. Damita Dunnings as patient's PCP. I have fixed this now. Disregard that. It was a mistake to do that.

## 2019-03-04 NOTE — Telephone Encounter (Signed)
Dr Damita Dunnings as PCP removed from the chart.

## 2019-03-04 NOTE — Telephone Encounter (Signed)
Spoke with patient today. Patient states that she has noticed her b/p running on higher side-readings per patient around 150s/90s, 140s/not sure. At first patient said this was going on for about 2 weeks or so but towards the end of conversation said probably about a month now. She has had some headaches and that is why she started to check her b/p. She has also had some "shimmering blurred vision" for a while. She is not having any weakness, numbness sensation, slurred speech, chest pain, SOB. At first patient wanted to schedule virtual for next week but after talking to [facility] and Dr. Damita Dunnings scheduled patient for today at 3 pm. Patient is at work and will be at work then so she is not able to check her vitals prior to appointment and not able to come by our office for that today either. Advised patient I would let Dr. Damita Dunnings know and maybe we could get patient to call with her vitals tomorrow. Patient is aware to seek medical attention right away if she feels worse or any of the symptoms mentioned above develop.

## 2019-03-05 DIAGNOSIS — G43909 Migraine, unspecified, not intractable, without status migrainosus: Secondary | ICD-10-CM | POA: Insufficient documentation

## 2019-03-05 DIAGNOSIS — I10 Essential (primary) hypertension: Secondary | ICD-10-CM | POA: Insufficient documentation

## 2019-03-05 NOTE — Assessment & Plan Note (Signed)
HTN- needs OV and labs prior to starting any medication.  She agrees.  We will get her scheduled.

## 2019-03-05 NOTE — Assessment & Plan Note (Signed)
Presumed migraines.  Needs HRT taper, needs eye clinic f/u (she can call about that), needs OV here.  Take tylenol if needed for episodes, which are happening about 1x/week.  In the meantime I would taper estrace as tolerated. Would try to skip a day of HRT and then gradually dec use from that point on.  She agrees.

## 2019-03-11 ENCOUNTER — Ambulatory Visit: Payer: BC Managed Care – PPO | Admitting: Family Medicine

## 2019-06-12 ENCOUNTER — Encounter: Payer: Self-pay | Admitting: Obstetrics and Gynecology

## 2019-06-12 ENCOUNTER — Ambulatory Visit (INDEPENDENT_AMBULATORY_CARE_PROVIDER_SITE_OTHER): Payer: BC Managed Care – PPO | Admitting: Obstetrics and Gynecology

## 2019-06-12 ENCOUNTER — Other Ambulatory Visit: Payer: Self-pay

## 2019-06-12 VITALS — BP 134/86 | HR 87 | Ht 67.0 in | Wt 213.3 lb

## 2019-06-12 DIAGNOSIS — Z1239 Encounter for other screening for malignant neoplasm of breast: Secondary | ICD-10-CM | POA: Diagnosis not present

## 2019-06-12 DIAGNOSIS — Z79890 Hormone replacement therapy (postmenopausal): Secondary | ICD-10-CM | POA: Diagnosis not present

## 2019-06-12 DIAGNOSIS — Z01419 Encounter for gynecological examination (general) (routine) without abnormal findings: Secondary | ICD-10-CM

## 2019-06-12 MED ORDER — ESTRADIOL 1 MG PO TABS: 1.0000 mg | ORAL_TABLET | Freq: Every day | ORAL | 12 refills | Status: DC
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
## 2019-06-17 ENCOUNTER — Other Ambulatory Visit: Payer: Self-pay

## 2019-06-17 ENCOUNTER — Other Ambulatory Visit: Payer: BC Managed Care – PPO

## 2019-06-17 ENCOUNTER — Telehealth: Payer: Self-pay | Admitting: Family Medicine

## 2019-06-17 NOTE — Telephone Encounter (Signed)
Patient called today stating that she fell out of a swing and hit her head on some bricks. She stated that there was some bleeding and now she has a knot where she hit her head.  We advised her since we did not have any appointments opened that we suggest her go to the ED so if they needed to do any imagining they could do everything there. Patient declined going to the ED she stated she would not go there. So we advised to start at urgent care then and start there then they will advise her if images are needed . She stated she will be going to the [facility] in [place] [employer]

## 2019-06-18 LAB — LIPID PANEL
Chol/HDL Ratio: 3.4 ratio (ref 0.0–4.4)
Cholesterol, Total: 188 mg/dL (ref 100–199)
HDL: 55 mg/dL (ref >39)
LDL Chol Calc (NIH): 95 mg/dL (ref 0–99)
Triglycerides: 224 mg/dL — ABNORMAL HIGH (ref 0–149)
VLDL Cholesterol Cal: 38 mg/dL (ref 5–40)

## 2019-06-18 LAB — HEMOGLOBIN A1C
Est. average glucose Bld gHb Est-mCnc: 108 mg/dL
Hgb A1c MFr Bld: 5.4 % (ref 4.8–5.6)

## 2019-06-18 LAB — TSH: TSH: 5.75 u[IU]/mL — ABNORMAL HIGH (ref 0.450–4.500)

## 2019-06-18 NOTE — Telephone Encounter (Signed)
Please get an update on patient.  Thanks.

## 2019-06-19 NOTE — Telephone Encounter (Signed)
Patient did go to [facility] and they cleaned the wound and said they were going to do a few staples but patient declined.  Spot was bandaged and no mention of imaging was made.

## 2019-06-20 NOTE — Telephone Encounter (Signed)
Noted. Thanks.

## 2019-08-09 ENCOUNTER — Other Ambulatory Visit: Payer: Self-pay | Admitting: Obstetrics and Gynecology

## 2019-12-16 ENCOUNTER — Other Ambulatory Visit: Payer: Self-pay

## 2019-12-16 ENCOUNTER — Encounter (HOSPITAL_COMMUNITY): Payer: Self-pay | Admitting: Emergency Medicine

## 2019-12-16 DIAGNOSIS — R531 Weakness: Secondary | ICD-10-CM | POA: Diagnosis not present

## 2019-12-16 DIAGNOSIS — J45909 Unspecified asthma, uncomplicated: Secondary | ICD-10-CM | POA: Diagnosis not present

## 2019-12-16 MED ORDER — SODIUM CHLORIDE 0.9% FLUSH: 3.0000 mL | Freq: Once | INTRAVENOUS | Status: DC

## 2019-12-16 NOTE — ED Triage Notes (Signed)
Patient comes from home by [facility]. Patient states she started having a headache, numbness and tingling in hands and feet, and general weakness all over that started all of a sudden.

## 2019-12-17 ENCOUNTER — Emergency Department (HOSPITAL_COMMUNITY)
Admission: EM | Admit: 2019-12-17 | Discharge: 2019-12-17 | Disposition: A | Discharge status: Home / Self Care | Payer: BC Managed Care – PPO | Attending: Emergency Medicine | Admitting: Emergency Medicine

## 2019-12-17 ENCOUNTER — Emergency Department (HOSPITAL_COMMUNITY): Payer: BC Managed Care – PPO

## 2019-12-17 DIAGNOSIS — R531 Weakness: Secondary | ICD-10-CM

## 2019-12-17 LAB — URINALYSIS, ROUTINE W REFLEX MICROSCOPIC
Bilirubin Urine: NEGATIVE
Glucose, UA: NEGATIVE mg/dL
Hgb urine dipstick: NEGATIVE
Ketones, ur: 5 mg/dL — AB
Leukocytes,Ua: NEGATIVE
Nitrite: NEGATIVE
Protein, ur: NEGATIVE mg/dL
Specific Gravity, Urine: 1.006 (ref 1.005–1.030)
pH: 8 (ref 5.0–8.0)

## 2019-12-17 LAB — BASIC METABOLIC PANEL
Anion gap: 9 (ref 5–15)
BUN: 12 mg/dL (ref 6–20)
CO2: 24 mmol/L (ref 22–32)
Calcium: 9 mg/dL (ref 8.9–10.3)
Chloride: 106 mmol/L (ref 98–111)
Creatinine, Ser: 0.65 mg/dL (ref 0.44–1.00)
GFR calc Af Amer: >60 mL/min (ref >60)
GFR calc non Af Amer: >60 mL/min (ref >60)
Glucose, Bld: 116 mg/dL — ABNORMAL HIGH (ref 70–99)
Potassium: 3.5 mmol/L (ref 3.5–5.1)
Sodium: 139 mmol/L (ref 135–145)

## 2019-12-17 LAB — CBC
HCT: 39.3 % (ref 36.0–46.0)
Hemoglobin: 13.5 g/dL (ref 12.0–15.0)
MCH: 29.9 pg (ref 26.0–34.0)
MCHC: 34.4 g/dL (ref 30.0–36.0)
MCV: 87.1 fL (ref 80.0–100.0)
Platelets: 265 10*3/uL (ref 150–400)
RBC: 4.51 MIL/uL (ref 3.87–5.11)
RDW: 12.5 % (ref 11.5–15.5)
WBC: 6.1 10*3/uL (ref 4.0–10.5)
nRBC: 0 % (ref 0.0–0.2)

## 2019-12-17 LAB — CBG MONITORING, ED: Glucose-Capillary: 113 mg/dL — ABNORMAL HIGH (ref 70–99)
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
## 2019-12-17 NOTE — ED Notes (Signed)
Pt ambulated to RR without assistance. Steady gait and no c/o dizziness/weakness.

## 2019-12-17 NOTE — Discharge Instructions (Signed)
Labs and head CT today were reassuring. I do recommend that you follow-up closely with your primary care doctor the next 1 to 2 weeks for recheck.  Copies of results attached on back for physician review. Please return here for any new or acute changes, specifically any recurrent focal weakness, numbness, blurred vision, difficulty talking, or trouble walking.

## 2019-12-17 NOTE — ED Notes (Signed)
Pt has a 20g IV in her left hand

## 2019-12-29 ENCOUNTER — Encounter: Payer: Self-pay | Admitting: Family Medicine

## 2019-12-29 ENCOUNTER — Ambulatory Visit: Payer: BC Managed Care – PPO | Admitting: Family Medicine

## 2019-12-29 ENCOUNTER — Other Ambulatory Visit: Payer: Self-pay

## 2019-12-29 VITALS — BP 118/80 | HR 63 | Temp 97.4°F | Ht 67.0 in | Wt 173.4 lb

## 2019-12-29 DIAGNOSIS — L409 Psoriasis, unspecified: Secondary | ICD-10-CM

## 2019-12-29 DIAGNOSIS — R7989 Other specified abnormal findings of blood chemistry: Secondary | ICD-10-CM | POA: Diagnosis not present

## 2019-12-29 DIAGNOSIS — G43809 Other migraine, not intractable, without status migrainosus: Secondary | ICD-10-CM

## 2019-12-29 LAB — TSH: TSH: 2.09 u[IU]/mL (ref 0.35–4.50)

## 2019-12-29 LAB — T4, FREE: Free T4: 0.75 ng/dL (ref 0.60–1.60)

## 2019-12-29 MED ORDER — TRIAMCINOLONE ACETONIDE 0.5 % EX CREA: 1.0000 "application " | TOPICAL_CREAM | Freq: Two times a day (BID) | CUTANEOUS | 2 refills | Status: DC | PRN

## 2019-12-29 NOTE — Patient Instructions (Signed)
Go to the lab on the way out.   If you have mychart we'll likely use that to update you.    Try triamcinolone cream for the rash.  Update me as needed.   I'll check with neurology in the meantime.   Take care.  Glad to see you.

## 2019-12-29 NOTE — Progress Notes (Signed)
This visit occurred during the SARS-CoV-2 public health emergency.  Safety protocols were in place, including screening questions prior to the visit, additional usage of staff PPE, and extensive cleaning of exam room while observing appropriate contact time as indicated for disinfecting solutions.  ER f/u.   ================= 54 y.o. F with hx of asthma, seasonal allergies, HTN, migraine headaches, asthma, presenting to the ED for generalized weakness.  Patient reports she was getting ready for bed tonight when all of a sudden she had "speckles" in her vision.  This was in both eyes.  States this persisted for a minute, then she all of sudden got very weak in all 4 of her extremities and felt like she could not move.  She states she was still able to hear and speak as normal.  She cannot specify exactly how long this lasted but it did resolve and briefly recurred again so EMS was called.  By time of my evaluation, she is asymptomatic and states she wants to go home.  She denies history of similar in the past.  Denies any recent head injury or trauma.  States she has a mild headache at present.  Does have history of migraines.  No neck pain or stiffness, fever, chills, sweats, or recent illness.  No changes in daily medications.  She is not on anticoagulation.  No nausea or vomiting. =================  She had shimmering/speckles in her eyes bilaterality.  She didn't have field cut/vision loss but she had blurry vision at the time.  She was talking to her husband, checked her BP at that point, BP was elevated but pulse was in the 50s.  She didn't have LOC but she had feeling that she couldn't move her ext or talk.   We talked about possible aura w/o migraine and HRT.    She has h/o mildly abnormal TSH.  She generally feels better/good.  She isn't having CP, SOB.  No focal neuro sx.  She didn't have postictal confusion.    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  She hasn't tried TAC. D/w pt about options.    Meds, vitals, and allergies reviewed.   ROS: Per HPI unless specifically indicated in ROS section   GEN: nad, alert and oriented HEENT: ncat NECK: supple w/o LA CV: rrr. PULM: ctab, no inc wob ABD: soft, +bs EXT: no edema SKIN: no acute rash but chronic psoriatic changes on the L ankle.  CN 2-12 wnl B, S/S wnl x4

## 2019-12-31 ENCOUNTER — Other Ambulatory Visit: Payer: Self-pay | Admitting: Family Medicine

## 2019-12-31 DIAGNOSIS — R7989 Other specified abnormal findings of blood chemistry: Secondary | ICD-10-CM | POA: Insufficient documentation

## 2019-12-31 DIAGNOSIS — G43009 Migraine without aura, not intractable, without status migrainosus: Secondary | ICD-10-CM

## 2019-12-31 NOTE — Assessment & Plan Note (Signed)
Can try triamcinolone cream and update me as needed.  She agrees.

## 2019-12-31 NOTE — Assessment & Plan Note (Addendum)
No sx currently.  Not typical for SZ and she didn't have LOC.  Unclear if this was an atypical migraine.  No clear trigger for event but she is on HRT and I would like neuro input.  If this was an atypical migraine, then we'll need to change her HRT, d/w pt.  She agrees.  At this point, with 1 event and no sx currently, still okay for outpatient f/u.   At least 30 minutes were devoted to patient care in this encounter (this can potentially include time spent reviewing the patient's file/history, interviewing and examining the patient, counseling/reviewing plan with patient, ordering referrals, ordering tests, reviewing relevant laboratory or x-ray data, and documenting the encounter).

## 2019-12-31 NOTE — Assessment & Plan Note (Signed)
Recheck TSH pending, see notes on labs.

## 2020-01-13 ENCOUNTER — Encounter: Payer: Self-pay | Admitting: Neurology

## 2020-02-16 ENCOUNTER — Telehealth: Payer: Self-pay

## 2020-02-16 NOTE — Telephone Encounter (Signed)
Pt said has a lot of congestion in head and chest; when blows nose has yellow mucus; non prod cough, runny nose, S/T mostly at night and has scratchy throat during the day. Upper abd pain and lower abd pain that was sharp this morning. Now pt has upper back pain all the way across and hurts worse when takes a deep breath. Pt said she generally feels sick. No fever or chills and no other covid symptoms other than listed above. Pt said had rapid covid test done at [facility] that was neg. Pt will go to UC in [place] for eval and possible CXR at [facility]. ([facility] does not have xray capability at this time. )_ FYI to Dr Damita Dunnings.

## 2020-02-17 NOTE — Telephone Encounter (Signed)
Please get update on patient.  Thanks. 

## 2020-02-19 NOTE — Telephone Encounter (Signed)
Message left for patient to return my call.  

## 2020-02-20 NOTE — Telephone Encounter (Signed)
Message left for patient to return my call.  

## 2020-02-23 ENCOUNTER — Encounter: Payer: Self-pay | Admitting: Family Medicine

## 2020-02-25 MED ORDER — CLOTRIMAZOLE-BETAMETHASONE 1-0.05 % EX CREA: 1.0000 "application " | TOPICAL_CREAM | Freq: Two times a day (BID) | CUTANEOUS | 0 refills | Status: DC

## 2020-02-25 NOTE — Telephone Encounter (Signed)
I spoke with pt; pt does not have any fever; there is no drainage from the area; couple of days ago there were a few white bumps on area but used abx ointment and white bumps are gone. Area where rash is sore but no pain, no swelling, no red streak areas and does not feel warm to touch. Pt will try the lotrisone cream and will cb if any new concerns or does not see improvement. 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 FYI to Dr Damita Dunnings.

## 2020-02-25 NOTE — Telephone Encounter (Signed)
I sent the patient a MyChart message but please call her back in the meantime about the rash.  It looks like she could have a superficial fungal infection that is irritated and I sent some Lotrisone cream to use in the meantime.  If she is having fever or other systemic symptoms or if she is clearly worse in the meantime then she needs to get checked first.  Thanks.

## 2020-02-26 NOTE — Telephone Encounter (Signed)
Noted. Thanks.

## 2020-03-01 ENCOUNTER — Encounter: Payer: Self-pay | Admitting: Family Medicine

## 2020-03-03 ENCOUNTER — Telehealth: Payer: Self-pay | Admitting: *Deleted

## 2020-03-03 DIAGNOSIS — R001 Bradycardia, unspecified: Secondary | ICD-10-CM

## 2020-03-03 NOTE — Telephone Encounter (Signed)
Patient left a voicemail stating that she was seen  for weakness and blood pressure problems back in March. Patient stated that she has been referred to neurologist and that appointment is at the end of June. Patient stated that she is still having episodes of weakness and shaking spells. Patient stated that her heart rate is most of the times in the 50's and blood pressure 139/ something. Patent stated that she is wondering if she should be referred to a cardiologist?

## 2020-03-04 NOTE — Telephone Encounter (Signed)
Please call pt.  I was more focused on her seeing neurology when I sent her my most recent note.  Given the bradycardia, it isn't inappropriate to see cardiology and I put in the referral.  If she has other changes in the meantime, then let me know/offer OV here.  Thanks.

## 2020-03-04 NOTE — Telephone Encounter (Signed)
Left detailed message on voicemail. DPR 

## 2020-03-12 ENCOUNTER — Encounter: Payer: Self-pay | Admitting: Cardiology

## 2020-03-12 ENCOUNTER — Ambulatory Visit: Payer: BC Managed Care – PPO | Admitting: Cardiology

## 2020-03-12 ENCOUNTER — Other Ambulatory Visit: Payer: Self-pay

## 2020-03-12 VITALS — BP 110/78 | HR 79 | Ht 66.0 in | Wt 159.5 lb

## 2020-03-12 DIAGNOSIS — R42 Dizziness and giddiness: Secondary | ICD-10-CM

## 2020-03-12 DIAGNOSIS — R001 Bradycardia, unspecified: Secondary | ICD-10-CM | POA: Diagnosis not present

## 2020-03-12 NOTE — Patient Instructions (Signed)
Medication Instructions:  No Changes *If you need a refill on your cardiac medications before your next appointment, please call your pharmacy*   Lab Work: None Ordered. If you have labs (blood work) drawn today and your tests are completely normal, you will receive your results only by: Marland Kitchen MyChart Message (if you have MyChart) OR . A paper copy in the mail If you have any lab test that is abnormal or we need to change your treatment, we will call you to review the results.   Testing/Procedures:  Your physician has recommended that you wear a Zio monitor. This monitor is a medical device that records the heart's electrical activity. Doctors most often use these monitors to diagnose arrhythmias. Arrhythmias are problems with the speed or rhythm of the heartbeat. The monitor is a small device applied to your chest. You can wear one while you do your normal daily activities. While wearing this monitor if you have any symptoms to push the button and record what you felt. Once you have worn this monitor for the period of time provider prescribed (Usually 14 days), you will return the monitor device in the postage paid box. Once it is returned they will download the data collected and provide [place] with a report which the provider will then review and we will call you with those results. Important tips:  1. Avoid showering during the first 24 hours of wearing the monitor. 2. Avoid excessive sweating to help maximize wear time. 3. Do not submerge the device, no hot tubs, and no swimming pools. 4. Keep any lotions or oils away from the patch. 5. After 24 hours you may shower with the patch on. Take brief showers with your back facing the shower head.  6. Do not remove patch once it has been placed because that will interrupt data and decrease adhesive wear time. 7. Push the button when you have any symptoms and write down what you were feeling. 8. Once you have completed wearing your monitor, remove and  place into box which has postage paid and place in your outgoing mailbox.  9. If for some reason you have misplaced your box then call our office and we can provide another box and/or mail it off for you.         Follow-Up: At [facility], you and your health needs are our priority.  As part of our continuing mission to provide you with exceptional heart care, we have created designated Provider Care Teams.  These Care Teams include your primary Cardiologist (physician) and Advanced Practice Providers (APPs -  Physician Assistants and Nurse Practitioners) who all work together to provide you with the care you need, when you need it.  We recommend signing up for the patient portal called "MyChart".  Sign up information is provided on this After Visit Summary.  MyChart is used to connect with patients for Virtual Visits (Telemedicine).  Patients are able to view lab/test results, encounter notes, upcoming appointments, etc.  Non-urgent messages can be sent to your provider as well.   To learn more about what you can do with MyChart, go to [email or web address].    Your next appointment:   5 week(s) After Zio results available The format for your next appointment:   In Person  Provider:   Kate Sable, MD   Other Instructions N/A

## 2020-03-12 NOTE — Progress Notes (Signed)
Cardiology Office Note:    Date:  03/12/2020   ID:  [name], DOB [birth year], MRN [id number]  PCP:  Tonia Ghent, MD  [facility] Cardiologist:  Kate Sable, MD  [place] Electrophysiologist:  None   Referring MD: Tonia Ghent, MD   Chief Complaint  Patient presents with  . other    Bradycardia. Meds reviewed verbally with pt.   [name] is a 54 y.o. female who is being seen today for the evaluation of bradycardia at the request of Tonia Ghent, MD.   History of Present Illness:    [name] is a 54 y.o. female with a hx of asthma who presents due to bradycardia.  Patient states having occasional symptoms of dizziness and fogginess over the past couple of months associated with low heart rates.  2 months ago while she was at home, she felt dizzy while sitting.  She also noticed her fingers to be a little cold.  Her husband checked her blood pressure which was elevated, her heart rate was noted to be low in the 40s.  Her husband called EMS and patient was taken to the ED.  Work-up with head CT was unrevealing.  EKG showed normal sinus rhythm.  In the ED, her symptoms have resolved.  Patient is scheduled to see neurology.  She states having similar episodes since with last episode being 1 week ago on her way from work.  She notes similar symptoms of coldness in her hands and not being able to concentrate.  her heart rate was low in the 50s.  She denies any history of heart disease, denies passing out, states being very healthy and eats a healthy diet.  Patient denies chest pain, shortness of breath, palpitations.  She says being asymptomatic whenever her heart rate is normal.  Past Medical History:  Diagnosis Date  . 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 Constipation   . Dysplasia of cervix, low grade (CIN 1)   . Environmental allergies   . Psoriasis     Past Surgical History:  Procedure Laterality Date  .  CESAREAN SECTION    . LAPAROSCOPIC VAGINAL HYSTERECTOMY WITH SALPINGO OOPHORECTOMY Bilateral 03/18/2018   Procedure: LAPAROSCOPIC ASSISTED VAGINAL HYSTERECTOMY WITH BILATERAL SALPINGO OOPHORECTOMY;  Surgeon: Brayton Mars, MD;  Location: [facility] ORS;  Service: Gynecology;  Laterality: Bilateral;  . LAPAROSCOPY    . MANDIBLE FRACTURE SURGERY    . TUBAL LIGATION      Current Medications: Current Meds  Medication Sig  . albuterol (VENTOLIN HFA) 108 (90 Base) MCG/ACT inhaler INHALE 2 PUFFS BY MOUTH EVERY 6 HOURS AS NEEDED FOR WHEEZE /BREATH SHORTNESS (USE PRIOR TO EXERCISE)  . clotrimazole-betamethasone (LOTRISONE) cream Apply 1 application topically 2 (two) times daily.  Marland Kitchen estradiol (ESTRACE) 1 MG tablet Take 0.5 mg by mouth daily.  . montelukast (SINGULAIR) 10 MG tablet Take 10 mg by mouth as needed.  . [DISCONTINUED] montelukast (SINGULAIR) 5 MG chewable tablet Chew 5 mg by mouth as needed.     Allergies:   Sulfamethoxazole-trimethoprim   Social History   Socioeconomic History  . Marital status: Married    Spouse name: Not on file  . Number of children: Not on file  . Years of education: Not on file  . Highest education level: Not on file  Occupational History  . Not on file  Tobacco Use  . Smoking status: Never Smoker  . Smokeless tobacco: Never Used  Vaping Use  .  Vaping Use: Never used  Substance and Sexual Activity  . Alcohol use: Yes    Alcohol/week: 0.0 standard drinks    Comment: rarely  . Drug use: No  . Sexual activity: Yes    Birth control/protection: Surgical  Other Topics Concern  . Not on file  Social History Narrative  . Not on file   Social Determinants of Health   Financial Resource Strain:   . Difficulty of Paying Living Expenses:   Food Insecurity:   . Worried About Charity fundraiser in the Last Year:   . Arboriculturist in the Last Year:   Transportation Needs:   . Film/video editor (Medical):   Marland Kitchen Lack of Transportation  (Non-Medical):   Physical Activity:   . Days of Exercise per Week:   . Minutes of Exercise per Session:   Stress:   . Feeling of Stress :   Social Connections:   . Frequency of Communication with Friends and Family:   . Frequency of Social Gatherings with Friends and Family:   . Attends Religious Services:   . Active Member of Clubs or Organizations:   . Attends Archivist Meetings:   Marland Kitchen Marital Status:      Family History: The patient's family history includes Colon cancer in her mother; Heart disease in her maternal grandfather; Uterine cancer in her mother. There is no history of Breast cancer, Ovarian cancer, or Diabetes.  ROS:   Please see the history of present illness.     All other systems reviewed and are negative.  EKGs/Labs/Other Studies Reviewed:    The following studies were reviewed today:   EKG:  EKG is  ordered today.  The ekg ordered today demonstrates sinus rhythm with sinus arrhythmias, heart rate 79  Recent Labs: 12/17/2019: BUN 12; Creatinine, Ser 0.65; Hemoglobin 13.5; Platelets 265; Potassium 3.5; Sodium 139 12/29/2019: TSH 2.09  Recent Lipid Panel    Component Value Date/Time   CHOL 188 06/17/2019 0822   TRIG 224 (H) 06/17/2019 0822   HDL 55 06/17/2019 0822   CHOLHDL 3.4 06/17/2019 0822   LDLCALC 95 06/17/2019 0822    Physical Exam:    VS:  BP 110/78 (BP Location: Left Arm, Patient Position: Sitting, Cuff Size: Normal)   Pulse 79   Ht 5\' 6"  (1.676 m)   Wt 159 lb 8 oz (72.3 kg)   LMP 02/11/2018 (Exact Date)   SpO2 98%   BMI 25.74 kg/m     Wt Readings from Last 3 Encounters:  03/12/20 159 lb 8 oz (72.3 kg)  12/29/19 173 lb 6 oz (78.6 kg)  06/12/19 213 lb 4.8 oz (96.8 kg)     GEN:  Well nourished, well developed in no acute distress HEENT: Normal NECK: No JVD; No carotid bruits LYMPHATICS: No lymphadenopathy CARDIAC: RRR, no murmurs, rubs, gallops RESPIRATORY:  Clear to auscultation without rales, wheezing or rhonchi    ABDOMEN: Soft, non-tender, non-distended MUSCULOSKELETAL:  No edema; No deformity  SKIN: Warm and dry NEUROLOGIC:  Alert and oriented x 3 PSYCHIATRIC:  Normal affect   ASSESSMENT:    1. Bradycardia   2. Dizziness    PLAN:    In order of problems listed above:  1. Patient with history dizziness, coldness in her fingers associated with bradycardia.  She notes heart rates as low as 40s when symptomatic.  EKG today shows normal sinus rhythm with heart rate 79.  Unsure if patient has high degree A-V dissociation contributing to symptoms.  We will place a cardiac monitor for 2 weeks to evaluate for any high degree AV block.  Follow-up after cardiac monitor.  This note was generated in part or whole with voice recognition software. Voice recognition is usually quite accurate but there are transcription errors that can and very often do occur. I apologize for any typographical errors that were not detected and corrected.  Medication Adjustments/Labs and Tests Ordered: Current medicines are reviewed at length with the patient today.  Concerns regarding medicines are outlined above.  Orders Placed This Encounter  Procedures  . LONG TERM MONITOR (3-14 DAYS)  . EKG 12-Lead   No orders of the defined types were placed in this encounter.   Patient Instructions  Medication Instructions:  No Changes *If you need a refill on your cardiac medications before your next appointment, please call your pharmacy*   Lab Work: None Ordered. If you have labs (blood work) drawn today and your tests are completely normal, you will receive your results only by: Marland Kitchen MyChart Message (if you have MyChart) OR . A paper copy in the mail If you have any lab test that is abnormal or we need to change your treatment, we will call you to review the results.   Testing/Procedures:  Your physician has recommended that you wear a Zio monitor. This monitor is a medical device that records the heart's electrical  activity. Doctors most often use these monitors to diagnose arrhythmias. Arrhythmias are problems with the speed or rhythm of the heartbeat. The monitor is a small device applied to your chest. You can wear one while you do your normal daily activities. While wearing this monitor if you have any symptoms to push the button and record what you felt. Once you have worn this monitor for the period of time provider prescribed (Usually 14 days), you will return the monitor device in the postage paid box. Once it is returned they will download the data collected and provide [place] with a report which the provider will then review and we will call you with those results. Important tips:  1. Avoid showering during the first 24 hours of wearing the monitor. 2. Avoid excessive sweating to help maximize wear time. 3. Do not submerge the device, no hot tubs, and no swimming pools. 4. Keep any lotions or oils away from the patch. 5. After 24 hours you may shower with the patch on. Take brief showers with your back facing the shower head.  6. Do not remove patch once it has been placed because that will interrupt data and decrease adhesive wear time. 7. Push the button when you have any symptoms and write down what you were feeling. 8. Once you have completed wearing your monitor, remove and place into box which has postage paid and place in your outgoing mailbox.  9. If for some reason you have misplaced your box then call our office and we can provide another box and/or mail it off for you.         Follow-Up: At [facility], you and your health needs are our priority.  As part of our continuing mission to provide you with exceptional heart care, we have created designated Provider Care Teams.  These Care Teams include your primary Cardiologist (physician) and Advanced Practice Providers (APPs -  Physician Assistants and Nurse Practitioners) who all work together to provide you with the care you need, when you  need it.  We recommend signing up for the patient portal called "MyChart".  Sign up information is provided on this After Visit Summary.  MyChart is used to connect with patients for Virtual Visits (Telemedicine).  Patients are able to view lab/test results, encounter notes, upcoming appointments, etc.  Non-urgent messages can be sent to your provider as well.   To learn more about what you can do with MyChart, go to [email or web address].    Your next appointment:   5 week(s) After Zio results available The format for your next appointment:   In Person  Provider:   Kate Sable, MD   Other Instructions N/A     Signed, Kate Sable, MD  03/12/2020 4:32 PM    [place]

## 2020-03-18 ENCOUNTER — Ambulatory Visit: Payer: BC Managed Care – PPO | Admitting: Neurology

## 2020-04-06 ENCOUNTER — Ambulatory Visit (INDEPENDENT_AMBULATORY_CARE_PROVIDER_SITE_OTHER): Payer: BC Managed Care – PPO

## 2020-04-06 ENCOUNTER — Ambulatory Visit
Admission: RE | Admit: 2020-04-06 | Discharge: 2020-04-06 | Disposition: A | Discharge status: Home / Self Care | Payer: BC Managed Care – PPO | Source: Ambulatory Visit | Attending: Obstetrics and Gynecology | Admitting: Obstetrics and Gynecology

## 2020-04-06 DIAGNOSIS — Z1239 Encounter for other screening for malignant neoplasm of breast: Secondary | ICD-10-CM | POA: Diagnosis present

## 2020-04-06 DIAGNOSIS — R001 Bradycardia, unspecified: Secondary | ICD-10-CM

## 2020-04-06 DIAGNOSIS — Z1231 Encounter for screening mammogram for malignant neoplasm of breast: Secondary | ICD-10-CM | POA: Diagnosis not present

## 2020-04-06 DIAGNOSIS — R42 Dizziness and giddiness: Secondary | ICD-10-CM

## 2020-04-08 ENCOUNTER — Other Ambulatory Visit: Payer: Self-pay | Admitting: Obstetrics and Gynecology

## 2020-04-08 DIAGNOSIS — R928 Other abnormal and inconclusive findings on diagnostic imaging of breast: Secondary | ICD-10-CM

## 2020-04-08 DIAGNOSIS — N6489 Other specified disorders of breast: Secondary | ICD-10-CM

## 2020-04-14 ENCOUNTER — Ambulatory Visit
Admission: RE | Admit: 2020-04-14 | Discharge: 2020-04-14 | Disposition: A | Discharge status: Home / Self Care | Payer: BC Managed Care – PPO | Source: Ambulatory Visit | Attending: Obstetrics and Gynecology | Admitting: Obstetrics and Gynecology

## 2020-04-14 DIAGNOSIS — R928 Other abnormal and inconclusive findings on diagnostic imaging of breast: Secondary | ICD-10-CM | POA: Insufficient documentation

## 2020-04-14 DIAGNOSIS — N6489 Other specified disorders of breast: Secondary | ICD-10-CM | POA: Insufficient documentation

## 2020-05-03 ENCOUNTER — Encounter: Payer: Self-pay | Admitting: Cardiology

## 2020-05-03 ENCOUNTER — Ambulatory Visit: Payer: BC Managed Care – PPO | Admitting: Cardiology

## 2020-05-03 ENCOUNTER — Other Ambulatory Visit: Payer: Self-pay

## 2020-05-03 VITALS — BP 100/62 | HR 60 | Ht 67.0 in | Wt 154.2 lb

## 2020-05-03 DIAGNOSIS — R002 Palpitations: Secondary | ICD-10-CM

## 2020-05-03 DIAGNOSIS — R001 Bradycardia, unspecified: Secondary | ICD-10-CM

## 2020-05-03 NOTE — Patient Instructions (Signed)

## 2020-05-03 NOTE — Progress Notes (Signed)
Cardiology Office Note:    Date:  05/03/2020   ID:  [name], DOB 1966-03-22, MRN [id number]  PCP:  Tonia Ghent, MD  [facility] Cardiologist:  Kate Sable, MD  [place] Electrophysiologist:  None   Referring MD: Tonia Ghent, MD   Chief Complaint  Patient presents with  . other    6 month follow up & discuss Zio monitor. Meds reviewed by the pt. verbally. Pt. c/o fluttering in chest at times.      History of Present Illness:    [name] is a 54 y.o. female with a hx of asthma who presents for follow-up of.  She was last seen due to bradycardia and dizziness associated with low heart rates.  Heart rates occasionally are low in the 40s.  She states feeling fine when heart rates are normal.  Cardiac monitor was placed to evaluate for any significant AV block or conduction abnormalities which may cause patient's symptoms.  She states having occasional palpitations while on the monitor.  She otherwise feels well.  She drinks about 5 cups of coffee daily.  Past Medical History:  Diagnosis Date  . 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 Constipation   . Dysplasia of cervix, low grade (CIN 1)   . Environmental allergies   . Psoriasis     Past Surgical History:  Procedure Laterality Date  . CESAREAN SECTION    . LAPAROSCOPIC VAGINAL HYSTERECTOMY WITH SALPINGO OOPHORECTOMY Bilateral 03/18/2018   Procedure: LAPAROSCOPIC ASSISTED VAGINAL HYSTERECTOMY WITH BILATERAL SALPINGO OOPHORECTOMY;  Surgeon: Brayton Mars, MD;  Location: [facility] ORS;  Service: Gynecology;  Laterality: Bilateral;  . LAPAROSCOPY    . MANDIBLE FRACTURE SURGERY    . TUBAL LIGATION      Current Medications: Current Meds  Medication Sig  . albuterol (VENTOLIN HFA) 108 (90 Base) MCG/ACT inhaler INHALE 2 PUFFS BY MOUTH EVERY 6 HOURS AS NEEDED FOR WHEEZE /BREATH SHORTNESS (USE PRIOR TO EXERCISE)  . clotrimazole-betamethasone (LOTRISONE) cream Apply 1  application topically 2 (two) times daily.  Marland Kitchen estradiol (ESTRACE) 1 MG tablet Take 0.5 mg by mouth daily.  . montelukast (SINGULAIR) 10 MG tablet Take 10 mg by mouth as needed.     Allergies:   Sulfamethoxazole-trimethoprim   Social History   Socioeconomic History  . Marital status: Married    Spouse name: Not on file  . Number of children: Not on file  . Years of education: Not on file  . Highest education level: Not on file  Occupational History  . Not on file  Tobacco Use  . Smoking status: Never Smoker  . Smokeless tobacco: Never Used  Vaping Use  . Vaping Use: Never used  Substance and Sexual Activity  . Alcohol use: Yes    Alcohol/week: 0.0 standard drinks    Comment: rarely  . Drug use: No  . Sexual activity: Yes    Birth control/protection: Surgical  Other Topics Concern  . Not on file  Social History Narrative  . Not on file   Social Determinants of Health   Financial Resource Strain:   . Difficulty of Paying Living Expenses:   Food Insecurity:   . Worried About Charity fundraiser in the Last Year:   . Arboriculturist in the Last Year:   Transportation Needs:   . Film/video editor (Medical):   Marland Kitchen Lack of Transportation (Non-Medical):   Physical Activity:   . Days of Exercise per Week:   .  Minutes of Exercise per Session:   Stress:   . Feeling of Stress :   Social Connections:   . Frequency of Communication with Friends and Family:   . Frequency of Social Gatherings with Friends and Family:   . Attends Religious Services:   . Active Member of Clubs or Organizations:   . Attends Archivist Meetings:   Marland Kitchen Marital Status:      Family History: The patient's family history includes Colon cancer in her mother; Heart disease in her maternal grandfather; Uterine cancer in her mother. There is no history of Breast cancer, Ovarian cancer, or Diabetes.  ROS:   Please see the history of present illness.     All other systems reviewed and are  negative.  EKGs/Labs/Other Studies Reviewed:    The following studies were reviewed today:   EKG:  EKG is  ordered today.  The ekg ordered today demonstrates sinus rhythm with sinus arrhythmias,  Recent Labs: 12/17/2019: BUN 12; Creatinine, Ser 0.65; Hemoglobin 13.5; Platelets 265; Potassium 3.5; Sodium 139 12/29/2019: TSH 2.09  Recent Lipid Panel    Component Value Date/Time   CHOL 188 06/17/2019 0822   TRIG 224 (H) 06/17/2019 0822   HDL 55 06/17/2019 0822   CHOLHDL 3.4 06/17/2019 0822   LDLCALC 95 06/17/2019 0822    Physical Exam:    VS:  BP 100/62 (BP Location: Left Arm, Patient Position: Sitting, Cuff Size: Normal)   Pulse 60   Ht 5\' 7"  (1.702 m)   Wt 154 lb 4 oz (70 kg)   LMP 02/11/2018 (Exact Date)   SpO2 99%   BMI 24.16 kg/m     Wt Readings from Last 3 Encounters:  05/03/20 154 lb 4 oz (70 kg)  03/12/20 159 lb 8 oz (72.3 kg)  12/29/19 173 lb 6 oz (78.6 kg)     GEN:  Well nourished, well developed in no acute distress HEENT: Normal NECK: No JVD; No carotid bruits LYMPHATICS: No lymphadenopathy CARDIAC: RRR, no murmurs, rubs, gallops RESPIRATORY:  Clear to auscultation without rales, wheezing or rhonchi  ABDOMEN: Soft, non-tender, non-distended MUSCULOSKELETAL:  No edema; No deformity  SKIN: Warm and dry NEUROLOGIC:  Alert and oriented x 3 PSYCHIATRIC:  Normal affect   ASSESSMENT:    1. Bradycardia   2. Palpitations    PLAN:    In order of problems listed above:  1. Patient with history dizziness associated with bradycardia.  2-week cardiac monitor did not reveal any significant heart blocks.  Heart rate low as 38 bpm around 11:30 PM, likely coinciding with when patient is asleep.  Patient reassured. 2. Patient states having occasional palpitations.  Monitor did reveal occasional PVCs associated with patient triggered events.  There was one episode of paroxysmal SVT not associated with patient events.  She drinks over 5 cups of coffee daily.  She was  advised to cut down on her caffeine intake.  If symptoms persist, may consider trial of beta-blockers, although patient not interested in beta-blocker trial at this point.  Follow-up in 6 months.  Total encounter time 40 minutes  Greater than 50% was spent in counseling and coordination of care with the patient   This note was generated in part or whole with voice recognition software. Voice recognition is usually quite accurate but there are transcription errors that can and very often do occur. I apologize for any typographical errors that were not detected and corrected.  Medication Adjustments/Labs and Tests Ordered: Current medicines are reviewed at length  with the patient today.  Concerns regarding medicines are outlined above.  Orders Placed This Encounter  Procedures  . EKG 12-Lead   No orders of the defined types were placed in this encounter.   Patient Instructions  Medication Instructions:  Your physician recommends that you continue on your current medications as directed. Please refer to the Current Medication list given to you today. *If you need a refill on your cardiac medications before your next appointment, please call your pharmacy*   Lab Work: None Ordered If you have labs (blood work) drawn today and your tests are completely normal, you will receive your results only by: Marland Kitchen MyChart Message (if you have MyChart) OR . A paper copy in the mail If you have any lab test that is abnormal or we need to change your treatment, we will call you to review the results.   Testing/Procedures: None Ordered   Follow-Up: At [facility], you and your health needs are our priority.  As part of our continuing mission to provide you with exceptional heart care, we have created designated Provider Care Teams.  These Care Teams include your primary Cardiologist (physician) and Advanced Practice Providers (APPs -  Physician Assistants and Nurse Practitioners) who all work  together to provide you with the care you need, when you need it.  We recommend signing up for the patient portal called "MyChart".  Sign up information is provided on this After Visit Summary.  MyChart is used to connect with patients for Virtual Visits (Telemedicine).  Patients are able to view lab/test results, encounter notes, upcoming appointments, etc.  Non-urgent messages can be sent to your provider as well.   To learn more about what you can do with MyChart, go to [email or web address].    Your next appointment:   6 month(s)  The format for your next appointment:   In Person  Provider:   Kate Sable, MD   Other Instructions      Signed, Kate Sable, MD  05/03/2020 4:07 PM    [place]

## 2020-06-15 ENCOUNTER — Encounter: Payer: BC Managed Care – PPO | Admitting: Obstetrics and Gynecology

## 2020-07-08 IMAGING — CT CT HEAD W/O CM
3 series · 16 of 47 positions shown, 19 images · non-contrast
Comparison: None.

CLINICAL DATA: Acute headache and generalized weakness

EXAM:
CT HEAD WITHOUT CONTRAST
TECHNIQUE: Contiguous axial images were obtained from the base of the skull
through the vertex without intravenous contrast.

[Series 2: head wo · axial · 0.47mm/px · z∈[+1495,+1620]mm · 10 of 30 slices shown, 13 images]
[im 3/30  brain]
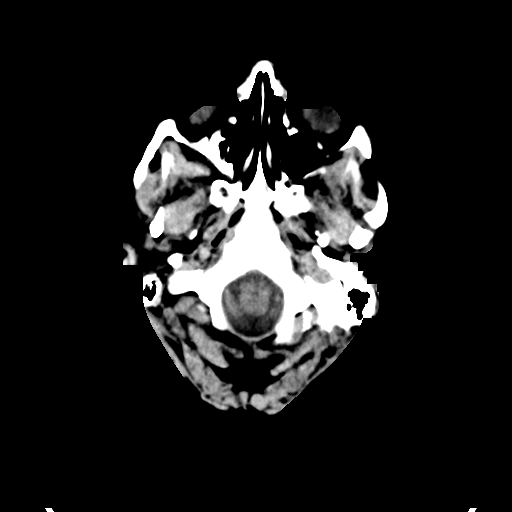
[im 3/30  bone]
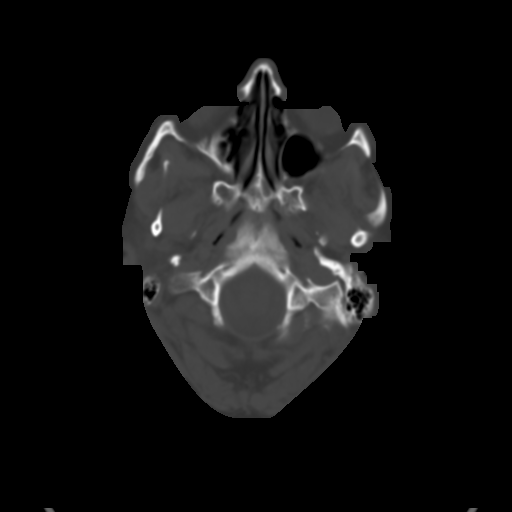
[im 6/30  brain]
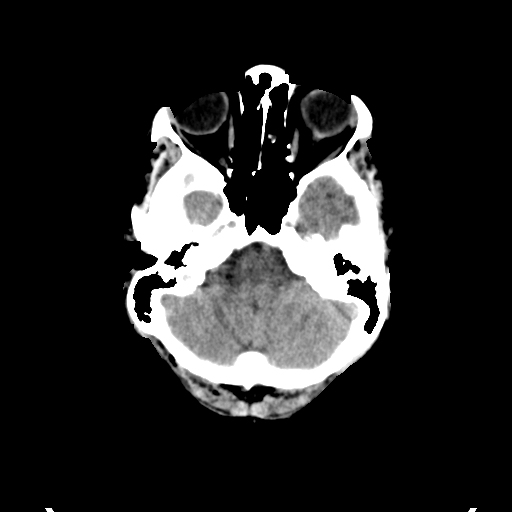
[im 9/30  brain]
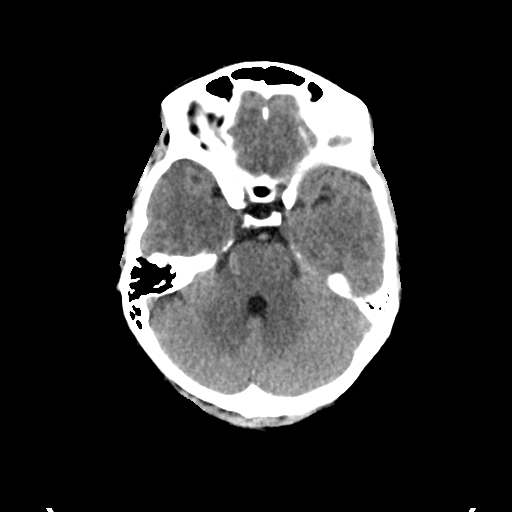
[im 11/30  brain]
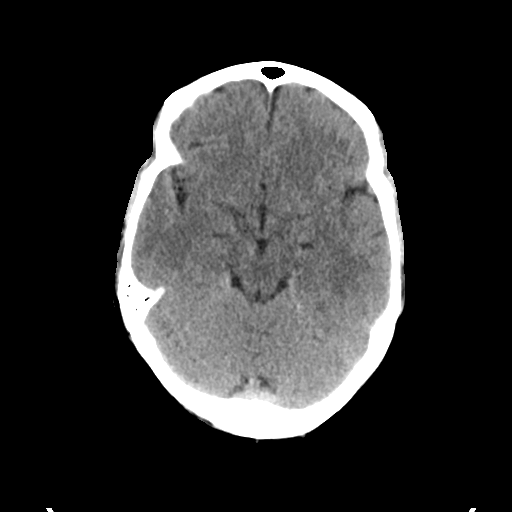
[im 14/30  brain]
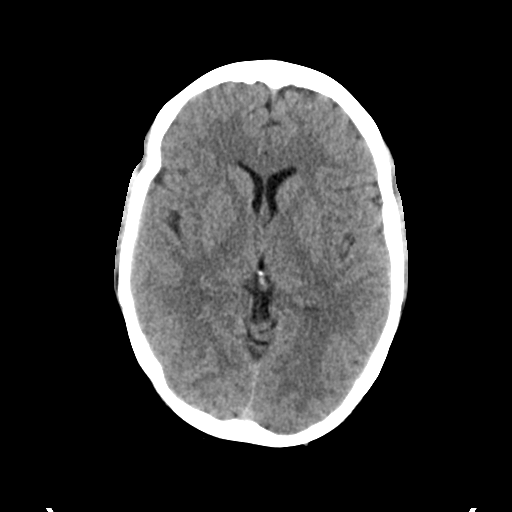
[im 14/30  bone]
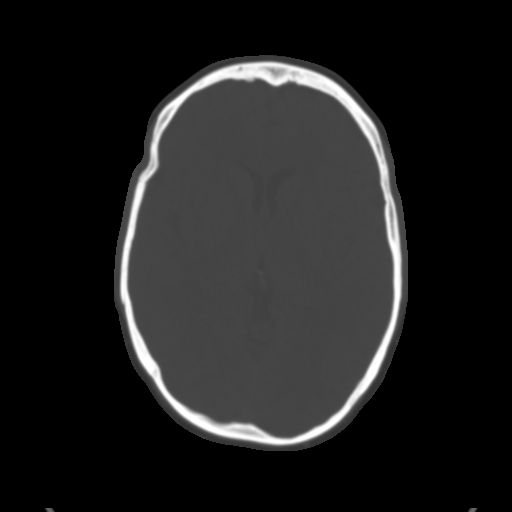
[im 17/30  brain]
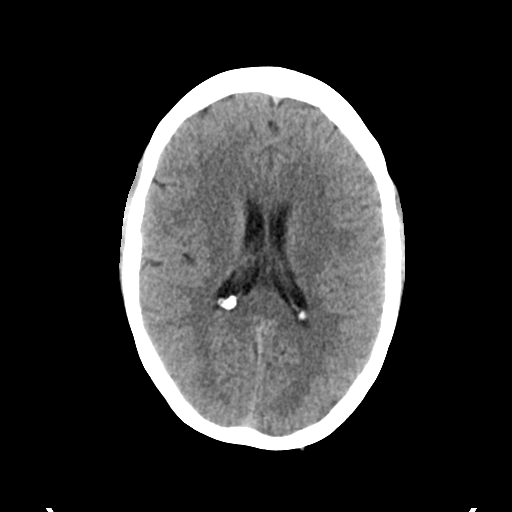
[im 20/30  brain]
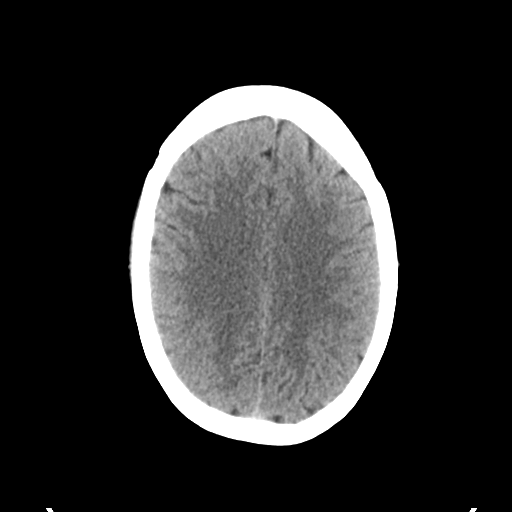
[im 23/30  brain]
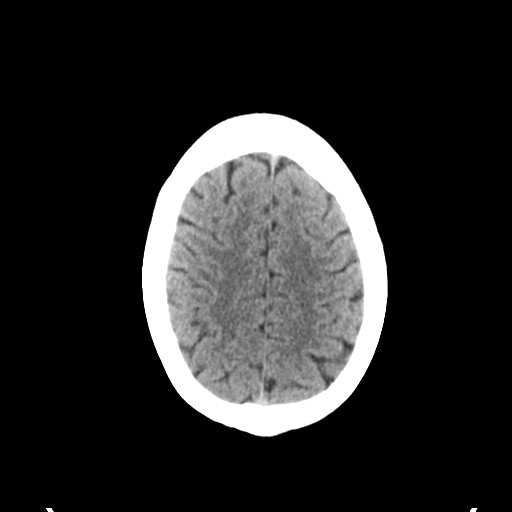
[im 25/30  brain]
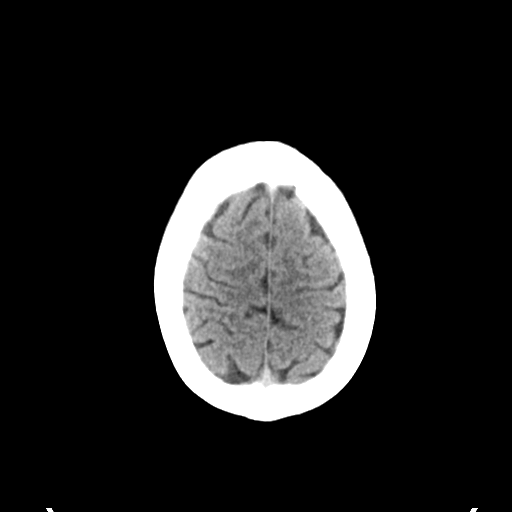
[im 25/30  bone]
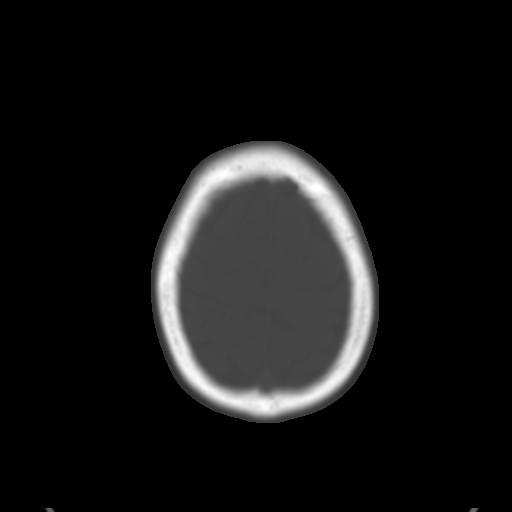
[im 28/30  brain]
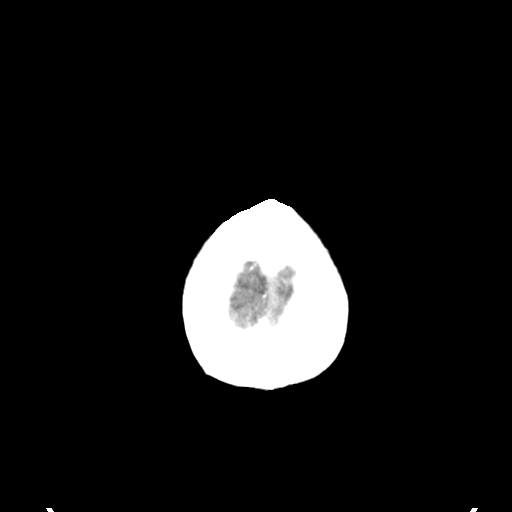

[Series 4: coronal soft tissue · coronal · 0.29mm/px · 3 of 63 slices shown]
[im 21/63  brain]
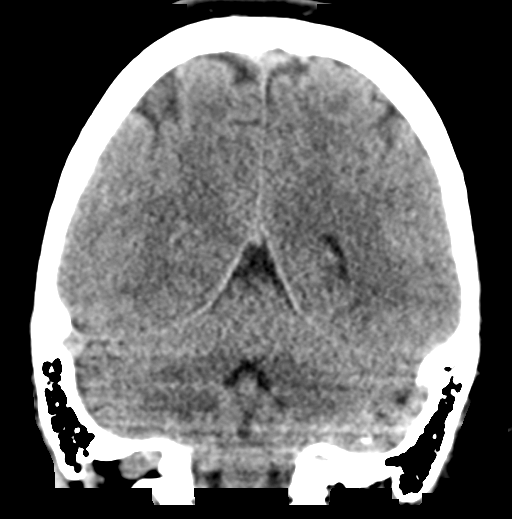
[im 28/63  brain]
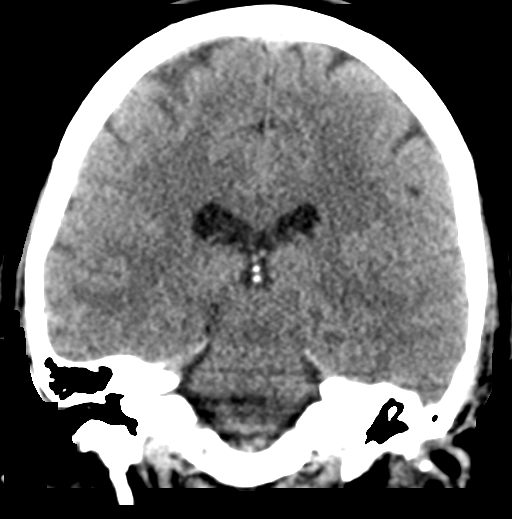
[im 35/63  brain]
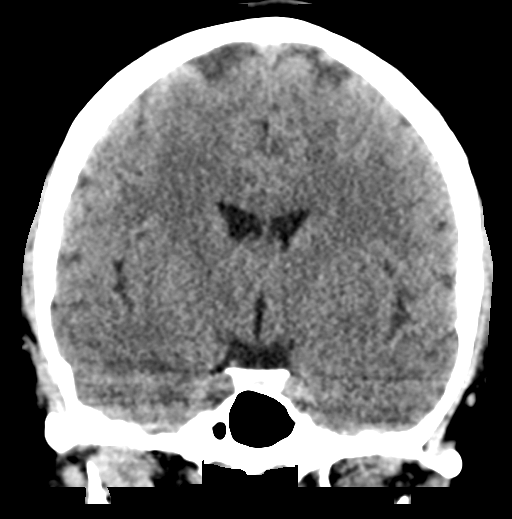

[Series 5: sagittal soft tissue · sagittal · 0.29mm/px · 3 of 47 slices shown]
[im 16/47  brain]
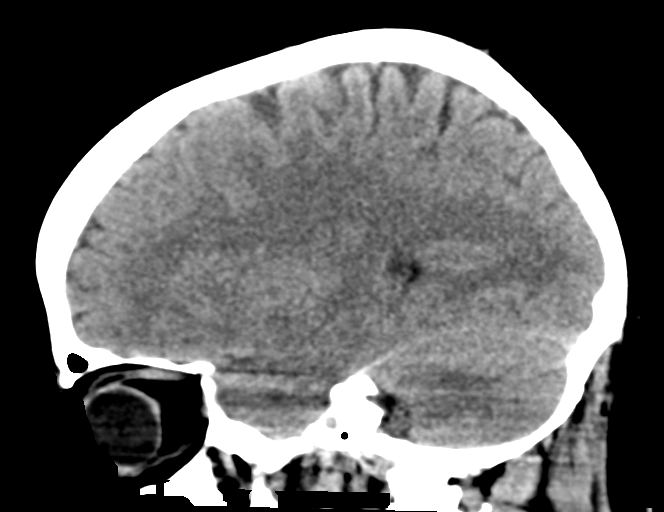
[im 24/47  brain]
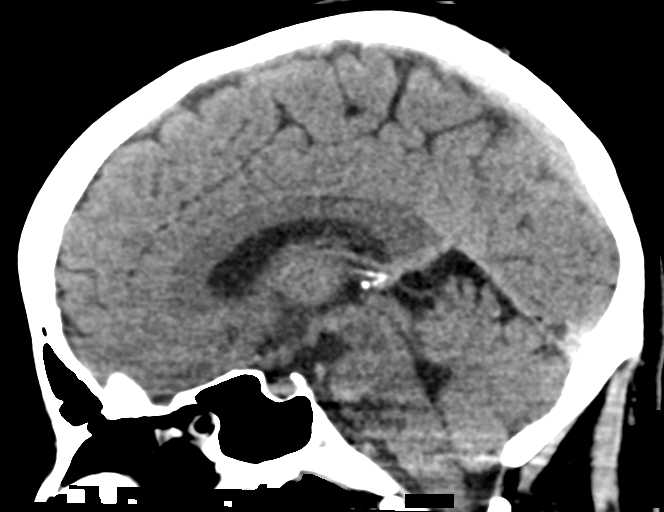
[im 31/47  brain]
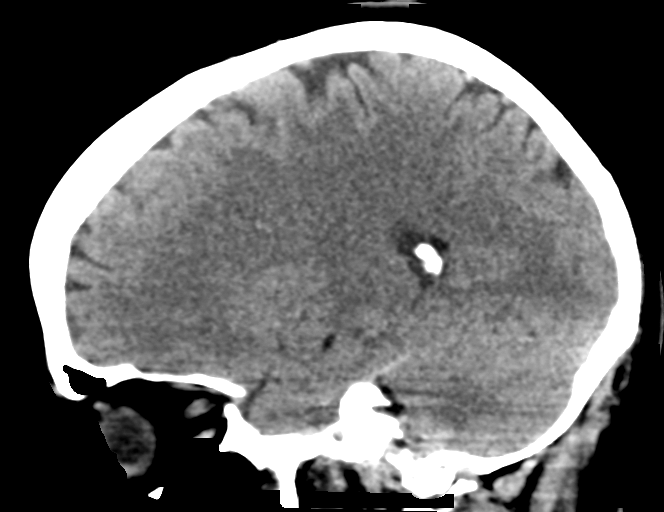

[16 of 47 positions shown; findings below may reference images not displayed]

FINDINGS: Brain: No evidence of acute territorial infarction, hemorrhage,
hydrocephalus,extra-axial collection or mass lesion/mass effect.
Normal gray-white differentiation. Ventricles are normal in size and
contour.

Vascular: No hyperdense vessel or unexpected calcification.

Skull: The skull is intact. No fracture or focal lesion identified.

Sinuses/Orbits: The visualized paranasal sinuses and mastoid air
cells are clear. The orbits and globes intact.

Other: None
IMPRESSION: No acute intracranial abnormality.

## 2020-08-23 ENCOUNTER — Encounter: Payer: Self-pay | Admitting: Family Medicine

## 2020-08-24 ENCOUNTER — Telehealth: Payer: Self-pay

## 2020-08-24 ENCOUNTER — Telehealth: Payer: Self-pay | Admitting: Family Medicine

## 2020-08-24 NOTE — Telephone Encounter (Signed)
Tried to call patient and unable to leave message because mailbox was full.

## 2020-08-24 NOTE — Telephone Encounter (Signed)
Please triage patient regarding Covid symptoms.  Please see when her symptoms started.  I preemptively routed this note to [facility] in the meantime in case she needs referral for infusion.  Thanks.

## 2020-08-24 NOTE — Telephone Encounter (Signed)
Called patient to follow-up per Dr. Damita Dunnings, but was sent to voicemail and voicemail is full. Unable to leave message. Will follow up later.

## 2020-08-25 NOTE — Telephone Encounter (Signed)
I spoke with [name]; [name] said symptoms started on 08/20/20; [name] is feeling tired today 08/25/20; [name] states she is feeling better today; still has some head congestion in head and when blows nose the mucus is clear. No other covid symptoms today but earlier in the week; [name] said has gone over her symptoms several times previously and would not want to redo previous symptoms but was willing to go over how she feels today.  [name] had been texted from someone previously about an antibody infusion ([name] said it could have been health dept but is not sure). [name] has not had monoclonal antibody infusion.  [name] said she thinks she is on the other side of the covid infection. [name] said last 2 days [name] has been able to get up and move around her home more. [name] thinks she will feel a lot better if can get her strength back.  [name] said the self quarantining will be over on 09/01/20. Advised [name] to continue drinking plenty of fluids, rest, tylenol if spikes fever. FYI to Dr Damita Dunnings.

## 2020-08-25 NOTE — Telephone Encounter (Signed)
Noted. Thanks.

## 2020-08-25 NOTE — Telephone Encounter (Signed)
MyChart message sent to patient.  Thank you for trying to check on patient.

## 2020-08-30 ENCOUNTER — Encounter: Payer: Self-pay | Admitting: Emergency Medicine

## 2020-08-30 ENCOUNTER — Telehealth: Payer: Self-pay

## 2020-08-30 ENCOUNTER — Other Ambulatory Visit: Payer: Self-pay

## 2020-08-30 ENCOUNTER — Ambulatory Visit
Admission: EM | Admit: 2020-08-30 | Discharge: 2020-08-30 | Disposition: A | Discharge status: Home / Self Care | Payer: BC Managed Care – PPO

## 2020-08-30 DIAGNOSIS — R5383 Other fatigue: Secondary | ICD-10-CM | POA: Diagnosis not present

## 2020-08-30 DIAGNOSIS — U071 COVID-19: Secondary | ICD-10-CM

## 2020-08-30 NOTE — Telephone Encounter (Signed)
LVM and sent mychart msg. Need to triage pt to see how she is currently doing and we need to know if she has been tested for covid, whether at home or lab test. Right now with her symptoms starting on 11/30, it is too late for her to have an infusion but she still needs to be triaged by a nurse.

## 2020-08-30 NOTE — Telephone Encounter (Signed)
[place] Night - Client TELEPHONE ADVICE RECORD [facility] Patient Name: [name] Gender: Female DOB: 1965-11-03 Age: 54 Y 43 M 16 D Return Phone Number: [phone] (Primary) Address[place] [address] Client [place] Night - Client Client Site [facility] Physician [name] - MD Contact Type Call Who Is Calling Patient / Member / Family / Caregiver Call Type Triage / Clinical Caller Name Vella Colquitt Relationship To Patient Self Return Phone Number [phone] (Primary) Chief Complaint Weakness, Generalized Reason for Call Symptomatic / Request for [place] states she is on day 8 of having covid. She is wanting to know if it is to late to do the antibody infusion . She is having congestion, weakness, and back pain Translation No Nurse Assessment Nurse: Ardine Bjork, RN, Melissa Date/Time (Eastern Time): 08/27/2020 7:07:05 PM Confirm and document reason for call. If symptomatic, describe symptoms. ---Caller states she is on day 8 of having covid. She is wanting to know if it is to late to do the antibody infusion . She is having nasal congestion, weakness, and back pain. Sight cough but resolved. Denies sob or fever-fever resolved 2 days. Weakness started 8 days ago(intermittent) and back pain started yesterdayentire back. Does the patient have any new or worsening symptoms? ---Yes Will a triage be completed? ---Yes Related visit to physician within the last 2 weeks? ---No Does the PT have any chronic conditions? (i.e. diabetes, asthma, this includes High risk factors for pregnancy, etc.) ---No Is the patient pregnant or possibly pregnant? (Ask all females between the ages of 49-55) ---No Is this a behavioral health or substance abuse call? ---No Guidelines Guideline Title Affirmed Question Affirmed Notes Nurse Date/Time  (Eastern Time) COVID-19 - Diagnosed or Suspected [1] COVID-19 diagnosed by positive lab test (e.g., PCR, rapid selftest kit) AND [2] mild symptoms (e.g., cough, Zayas, RN, Melissa 08/27/2020 7:09:25 PM PLEASE NOTE: All timestamps contained within this report are represented as [place] Standard Time. CONFIDENTIALTY NOTICE: This fax transmission is intended only for the addressee. It contains information that is legally privileged, confidential or otherwise protected from use or disclosure. If you are not the intended recipient, you are strictly prohibited from reviewing, disclosing, copying using or disseminating any of this information or taking any action in reliance on or regarding this information. If you have received this fax in error, please notify us immediately by telephone so that we can arrange for its return to [place]. Phone: [phone], Toll-Free: [phone], Fax: [phone] Page: 2 of 2 Call Id: [id number] Guidelines Guideline Title Affirmed Question Affirmed Notes Nurse Date/Time Eilene Ghazi Time) fever, others) AND [2] no complications or SOB Heart Rate and Heartbeat Questions [1] Dizziness, lightheadedness, or weakness AND [2] heart beating very slowly (e.g., < 50 / minute) Zayas, RN, Melissa 08/27/2020 7:19:57 PM Disp. Time Eilene Ghazi Time) Disposition Final User 08/27/2020 [place], RN, Lenna Sciara 08/27/2020 7:24:44 PM 911 Outcome Documentation Zayas, RN, Melissa Reason: Pt refuses 911-states will think about it with her husband. 08/27/2020 7:23:56 PM Call EMS 911 Now Yes Zayas, RN, Lenna Sciara Caller Disagree/Comply Disagree Caller Understands Yes PreDisposition Call Doctor Care Advice Given Per Guideline HOME CARE: * You should be able to treat this at home. REASSURANCE AND EDUCATION - POSITIVE COVID-19 LAB TEST AND MILD SYMPTOMS: * You had a recent lab test for COVID-19 and it came back positive. * A positive result on a PCR or rapid self-test kit is  highly accurate for diagnosing COVID-19. It is highly likely that you have COVID-19. * From what you have told me, your symptoms are mild. That is reassuring. * Here's some care advice to help you and to help prevent others from getting sick. CALL BACK IF: * Fever over 103 F (39.4 C) * Fever lasts over 3 days * Fever returns after being gone for 24 hours * Chest pain or difficulty breathing occurs * You become worse CARE ADVICE given per COVID-19 - DIAGNOSED OR SUSPECTED (Adult) guideline. CALL EMS 911 NOW: * Immediate medical attention is needed. You need to hang up and call 911 (or an ambulance). * Triager Discretion: I'll call you back in a few minutes to be sure you were able to reach them. CARE ADVICE given per Heart Rate and Heartbeat Questions (Adult) guideline. Referrals GO TO FACILITY REFUSED

## 2020-08-30 NOTE — Telephone Encounter (Signed)
Patient has called several times. Patient is complaining of being weak and shakey. She has also stated that her heart rate has been really low. Patient has spoke to access nursing and was advised (per the patient) to make an appt. I spoke with Dr Darnell Level about her and being we had no appts available he advised her to go to urgent care or to the ER. Patient states that she went to the urgent care and no one acknowledged her. She refused the ER. She demanded appt with [place]. As I was working with patient she either dropped the call or the system did. I will have the triage nurse call her back EM

## 2020-08-30 NOTE — Discharge Instructions (Addendum)
It is normal to feel this way after having COVID.  Symptoms can last for a few weeks. Rest, stay hydrated and eat healthy meals to keep your energy up Follow up as needed for continued or worsening symptoms

## 2020-08-30 NOTE — Telephone Encounter (Signed)
Seen at [facility].  plz call tomorrow for an update.

## 2020-08-30 NOTE — Telephone Encounter (Signed)
I spoke with pt; I advised pt that per access nurse note pt was having weakness. Congestion, back pain and low heart rate; pt acknowledged all these symptoms  And pt said that her heart rate had been in high 40s and low 50's for past few days. Pt said that she had been on hold at our office for over 2 hours. I apologized and just got brought into the call and advised pt per [name] at front desk that with the symptoms pt was having [name] thought that pt should go to UC to be evaluated. Pt said that she went to [facility] on [address] in [place] and sat there for 30 mins and no one acknowledged her; I asked if she spoke to anyone there and she said no that she decided she would leave and be seen by her doctor. I advised that [name] was out of the office this afternoon on family emergency and the doctor who was here thought that the best care for the pt was to be evaluated today at [facility] (no available appts at [facility] this afternoon). Pt said did she have a doctor at [facility] or not; I advised yes ma'am but with the symptoms she is having she does not need to wait until tomorrow to be seen. Pt said she is not going to ED or UC and wants an appt to "pop in and see someone today." I advised pt that she did not need a pop in appt that she needed an appt to be evaluated and testing done if needed. Pt voiced understanding and said that she would go back to [facility] now. Pt said they close at 4pm today and what if they advise her to go to ED. I said if they do not feel they can take care of her needs they might tell her or send her to an ED but [name] thought ok to start with UC. Offered info about [facility] on [address] or [place] but pt said that was too far to go and she would go back to [facility]. I asked pt if she felt like driving because she was weak and now pt was crying. Pt said yes she could drive and I thanked her for going back to UC. FYI to [name] as PCP and [name].

## 2020-08-30 NOTE — ED Triage Notes (Signed)
Patient c/o fatigue and shaky x 10 days.   Patient states she's at her 10th day from positive COVID-19 result.   Patent denies a current fever.  Patient hasn't tried any medications for symptom management.   Patent states has a normal diet intake.

## 2020-08-31 ENCOUNTER — Ambulatory Visit: Payer: BC Managed Care – PPO | Admitting: Family Medicine

## 2020-08-31 NOTE — Telephone Encounter (Signed)
VM was full so I sent her a Message thru MyChart to see how she was doing
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
## 2020-08-31 NOTE — Telephone Encounter (Signed)
I was out of clinic unexpectedly yesterday due to a family emergency.  Please get update on patient.  I hope she is feeling better in the meantime.  Thanks.

## 2020-09-10 NOTE — Telephone Encounter (Signed)
Finally able to contact pt who reports she is doing well. Advised if anything is needed to contact office. Pt verbalized understanding.

## 2020-09-12 NOTE — Telephone Encounter (Signed)
Thanks

## 2020-09-16 NOTE — Telephone Encounter (Signed)
Opened in error

## 2020-09-28 ENCOUNTER — Encounter: Payer: BC Managed Care – PPO | Admitting: Obstetrics and Gynecology

## 2020-09-30 ENCOUNTER — Other Ambulatory Visit: Payer: Self-pay

## 2020-09-30 ENCOUNTER — Ambulatory Visit (INDEPENDENT_AMBULATORY_CARE_PROVIDER_SITE_OTHER): Payer: Self-pay | Admitting: Obstetrics and Gynecology

## 2020-09-30 ENCOUNTER — Encounter: Payer: Self-pay | Admitting: Obstetrics and Gynecology

## 2020-09-30 VITALS — BP 120/80 | HR 65 | Ht 67.0 in | Wt 141.2 lb

## 2020-09-30 DIAGNOSIS — Z1231 Encounter for screening mammogram for malignant neoplasm of breast: Secondary | ICD-10-CM

## 2020-09-30 DIAGNOSIS — Z01419 Encounter for gynecological examination (general) (routine) without abnormal findings: Secondary | ICD-10-CM

## 2020-09-30 DIAGNOSIS — N951 Menopausal and female climacteric states: Secondary | ICD-10-CM

## 2020-09-30 MED ORDER — ESTRADIOL 1 MG PO TABS: 0.5000 mg | ORAL_TABLET | Freq: Every day | ORAL | 3 refills | Status: DC

## 2020-09-30 MED ORDER — ESTRADIOL 1 MG PO TABS: 1.0000 mg | ORAL_TABLET | Freq: Every day | ORAL | 3 refills | Status: DC
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
## 2020-10-04 ENCOUNTER — Telehealth: Payer: Self-pay | Admitting: Family Medicine

## 2020-10-04 NOTE — Telephone Encounter (Signed)
Please schedule patient when possible to evaluate/talk about her weight loss.  Thanks.

## 2020-10-06 ENCOUNTER — Encounter: Payer: BC Managed Care – PPO | Admitting: Obstetrics and Gynecology

## 2020-10-15 NOTE — Telephone Encounter (Signed)
Called pt couldn't leave vm it was full

## 2020-11-08 ENCOUNTER — Ambulatory Visit: Payer: BC Managed Care – PPO | Admitting: Cardiology

## 2020-11-23 ENCOUNTER — Other Ambulatory Visit: Payer: Self-pay | Admitting: Obstetrics and Gynecology

## 2020-11-23 DIAGNOSIS — R634 Abnormal weight loss: Secondary | ICD-10-CM

## 2020-11-26 ENCOUNTER — Other Ambulatory Visit: Payer: Self-pay

## 2020-11-26 ENCOUNTER — Encounter: Payer: Self-pay | Admitting: Cardiology

## 2020-11-26 ENCOUNTER — Ambulatory Visit (INDEPENDENT_AMBULATORY_CARE_PROVIDER_SITE_OTHER): Payer: BC Managed Care – PPO | Admitting: Cardiology

## 2020-11-26 VITALS — BP 118/62 | HR 57 | Ht 66.0 in | Wt 134.0 lb

## 2020-11-26 DIAGNOSIS — R001 Bradycardia, unspecified: Secondary | ICD-10-CM

## 2020-11-26 DIAGNOSIS — R002 Palpitations: Secondary | ICD-10-CM

## 2020-11-26 NOTE — Progress Notes (Signed)
Cardiology Office Note:    Date:  11/26/2020   ID:  [name], DOB Feb 11, 1966, MRN [id number]  PCP:  Tonia Ghent, MD  [facility] Cardiologist:  Kate Sable, MD  [place] Electrophysiologist:  None   Referring MD: Tonia Ghent, MD   Chief Complaint  Patient presents with  . Other    6 month follow up. Meds reviewed verbally with patient.      History of Present Illness:    [name] is a 55 y.o. female with a hx of asthma who presents for follow-up .    Previously seen for bradycardia and also palpitations.  Cardiac monitor with no significant arrhythmias.  Caffeine intake reduction advised.  She states doing okay, denies any worrisome palpitations or dizziness.  She thinks she might have a thyroid disorder, following up with OB/GYN where thyroid function testing is being planned.  She otherwise feels well, has no concerns at this time.   Past Medical History:  Diagnosis Date  . 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 Constipation   . Dysplasia of cervix, low grade (CIN 1)   . Environmental allergies   . Psoriasis     Past Surgical History:  Procedure Laterality Date  . CESAREAN SECTION    . LAPAROSCOPIC VAGINAL HYSTERECTOMY WITH SALPINGO OOPHORECTOMY Bilateral 03/18/2018   Procedure: LAPAROSCOPIC ASSISTED VAGINAL HYSTERECTOMY WITH BILATERAL SALPINGO OOPHORECTOMY;  Surgeon: Brayton Mars, MD;  Location: [facility] ORS;  Service: Gynecology;  Laterality: Bilateral;  . LAPAROSCOPY    . MANDIBLE FRACTURE SURGERY    . TUBAL LIGATION      Current Medications: Current Meds  Medication Sig  . albuterol (VENTOLIN HFA) 108 (90 Base) MCG/ACT inhaler INHALE 2 PUFFS BY MOUTH EVERY 6 HOURS AS NEEDED FOR WHEEZE /BREATH SHORTNESS (USE PRIOR TO EXERCISE)  . clotrimazole-betamethasone (LOTRISONE) cream Apply 1 application topically 2 (two) times daily.  Marland Kitchen estradiol (ESTRACE) 1 MG tablet Take 1 tablet (1 mg total) by mouth daily.   . montelukast (SINGULAIR) 10 MG tablet Take 10 mg by mouth as needed.     Allergies:   Sulfamethoxazole-trimethoprim   Social History   Socioeconomic History  . Marital status: Married    Spouse name: Not on file  . Number of children: Not on file  . Years of education: Not on file  . Highest education level: Not on file  Occupational History  . Not on file  Tobacco Use  . Smoking status: Never Smoker  . Smokeless tobacco: Never Used  Vaping Use  . Vaping Use: Never used  Substance and Sexual Activity  . Alcohol use: Yes    Alcohol/week: 0.0 standard drinks    Comment: rarely  . Drug use: No  . Sexual activity: Yes    Birth control/protection: Surgical  Other Topics Concern  . Not on file  Social History Narrative  . Not on file   Social Determinants of Health   Financial Resource Strain: Not on file  Food Insecurity: Not on file  Transportation Needs: Not on file  Physical Activity: Not on file  Stress: Not on file  Social Connections: Not on file     Family History: The patient's family history includes Colon cancer in her mother; Heart disease in her maternal grandfather; Uterine cancer in her mother. There is no history of Breast cancer, Ovarian cancer, or Diabetes.  ROS:   Please see the history of present illness.     All other systems  reviewed and are negative.  EKGs/Labs/Other Studies Reviewed:    The following studies were reviewed today:   EKG:  EKG is  ordered today.  The ekg ordered today demonstrates sinus bradycardia, heart rate 57, otherwise normal ECG  Recent Labs: 12/17/2019: BUN 12; Creatinine, Ser 0.65; Hemoglobin 13.5; Platelets 265; Potassium 3.5; Sodium 139 12/29/2019: TSH 2.09  Recent Lipid Panel    Component Value Date/Time   CHOL 188 06/17/2019 0822   TRIG 224 (H) 06/17/2019 0822   HDL 55 06/17/2019 0822   CHOLHDL 3.4 06/17/2019 0822   LDLCALC 95 06/17/2019 0822    Physical Exam:    VS:  BP 118/62 (BP Location: Left Arm,  Patient Position: Sitting, Cuff Size: Normal)   Pulse (!) 57   Ht 5\' 6"  (1.676 m)   Wt 134 lb (60.8 kg)   LMP 02/11/2018 (Exact Date)   SpO2 97%   BMI 21.63 kg/m     Wt Readings from Last 3 Encounters:  11/26/20 134 lb (60.8 kg)  09/30/20 141 lb 3.2 oz (64 kg)  05/03/20 154 lb 4 oz (70 kg)     GEN:  Well nourished, well developed in no acute distress HEENT: Normal NECK: No JVD; No carotid bruits LYMPHATICS: No lymphadenopathy CARDIAC: RRR, no murmurs, rubs, gallops RESPIRATORY:  Clear to auscultation without rales, wheezing or rhonchi  ABDOMEN: Soft, non-tender, non-distended MUSCULOSKELETAL:  No edema; No deformity  SKIN: Warm and dry NEUROLOGIC:  Alert and oriented x 3 PSYCHIATRIC:  Normal affect   ASSESSMENT:    1. Bradycardia   2. Palpitations    PLAN:    In order of problems listed above:  1. Asymptomatic bradycardia, feels well, EKG showing sinus bradycardia heart rate 57 otherwise normal.  No indication for pacemaker. 2. Palpitations, monitor weight one episode of SVT and rare PVCs.  No significant arrhythmias.  No significant palpitations.  Follow-up as needed  Total encounter time 35 minutes  Greater than 50% was spent in counseling and coordination of care with the patient    Medication Adjustments/Labs and Tests Ordered: Current medicines are reviewed at length with the patient today.  Concerns regarding medicines are outlined above.  Orders Placed This Encounter  Procedures  . EKG 12-Lead   No orders of the defined types were placed in this encounter.   Patient Instructions  Medication Instructions:  Your physician recommends that you continue on your current medications as directed. Please refer to the Current Medication list given to you today.  *If you need a refill on your cardiac medications before your next appointment, please call your pharmacy*   Lab Work: None ordered If you have labs (blood work) drawn today and your tests are  completely normal, you will receive your results only by: Marland Kitchen MyChart Message (if you have MyChart) OR . A paper copy in the mail If you have any lab test that is abnormal or we need to change your treatment, we will call you to review the results.   Testing/Procedures: None ordered   Follow-Up: At [facility], you and your health needs are our priority.  As part of our continuing mission to provide you with exceptional heart care, we have created designated Provider Care Teams.  These Care Teams include your primary Cardiologist (physician) and Advanced Practice Providers (APPs -  Physician Assistants and Nurse Practitioners) who all work together to provide you with the care you need, when you need it.  We recommend signing up for the patient portal called "MyChart".  Sign up information is provided on this After Visit Summary.  MyChart is used to connect with patients for Virtual Visits (Telemedicine).  Patients are able to view lab/test results, encounter notes, upcoming appointments, etc.  Non-urgent messages can be sent to your provider as well.   To learn more about what you can do with MyChart, go to [email or web address].    Your next appointment:   Follow up as needed   The format for your next appointment:   In Person  Provider:   Kate Sable, MD   Other Instructions      Signed, Kate Sable, MD  11/26/2020 12:55 PM    [place]

## 2020-11-26 NOTE — Patient Instructions (Signed)

## 2020-11-27 LAB — CBC WITH DIFFERENTIAL/PLATELET
Basophils Absolute: 0.1 10*3/uL (ref 0.0–0.2)
Basos: 1 %
EOS (ABSOLUTE): 0.2 10*3/uL (ref 0.0–0.4)
Eos: 3 %
Hematocrit: 43.3 % (ref 34.0–46.6)
Hemoglobin: 14 g/dL (ref 11.1–15.9)
Immature Grans (Abs): 0 10*3/uL (ref 0.0–0.1)
Immature Granulocytes: 0 %
Lymphocytes Absolute: 1.8 10*3/uL (ref 0.7–3.1)
Lymphs: 30 %
MCH: 29.2 pg (ref 26.6–33.0)
MCHC: 32.3 g/dL (ref 31.5–35.7)
MCV: 90 fL (ref 79–97)
Monocytes Absolute: 0.5 10*3/uL (ref 0.1–0.9)
Monocytes: 8 %
Neutrophils Absolute: 3.5 10*3/uL (ref 1.4–7.0)
Neutrophils: 58 %
Platelets: 240 10*3/uL (ref 150–450)
RBC: 4.79 x10E6/uL (ref 3.77–5.28)
RDW: 12.7 % (ref 11.7–15.4)
WBC: 5.9 10*3/uL (ref 3.4–10.8)

## 2020-11-27 LAB — COMPREHENSIVE METABOLIC PANEL
ALT: 17 IU/L (ref 0–32)
AST: 17 IU/L (ref 0–40)
Albumin/Globulin Ratio: 2 (ref 1.2–2.2)
Albumin: 4.5 g/dL (ref 3.8–4.9)
Alkaline Phosphatase: 60 IU/L (ref 44–121)
BUN/Creatinine Ratio: 17 (ref 9–23)
BUN: 12 mg/dL (ref 6–24)
Bilirubin Total: 0.4 mg/dL (ref 0.0–1.2)
CO2: 24 mmol/L (ref 20–29)
Calcium: 9.3 mg/dL (ref 8.7–10.2)
Chloride: 104 mmol/L (ref 96–106)
Creatinine, Ser: 0.71 mg/dL (ref 0.57–1.00)
Globulin, Total: 2.3 g/dL (ref 1.5–4.5)
Glucose: 75 mg/dL (ref 65–99)
Potassium: 4.1 mmol/L (ref 3.5–5.2)
Sodium: 143 mmol/L (ref 134–144)
Total Protein: 6.8 g/dL (ref 6.0–8.5)
eGFR: 102 mL/min/{1.73_m2} (ref >59)

## 2020-11-27 LAB — HEPATITIS C ANTIBODY: Hep C Virus Ab: <0.1 s/co ratio (ref 0.0–0.9)

## 2020-11-27 LAB — C-REACTIVE PROTEIN: CRP: <1 mg/L (ref 0–10)

## 2020-11-27 LAB — THYROID PANEL WITH TSH
Free Thyroxine Index: 1.8 (ref 1.2–4.9)
T3 Uptake Ratio: 27 % (ref 24–39)
T4, Total: 6.8 ug/dL (ref 4.5–12.0)
TSH: 2.56 u[IU]/mL (ref 0.450–4.500)

## 2020-11-27 LAB — HEMOGLOBIN A1C
Est. average glucose Bld gHb Est-mCnc: 100 mg/dL
Hgb A1c MFr Bld: 5.1 % (ref 4.8–5.6)

## 2020-11-27 LAB — SEDIMENTATION RATE: Sed Rate: 2 mm/hr (ref 0–40)

## 2021-01-03 ENCOUNTER — Ambulatory Visit: Payer: BC Managed Care – PPO | Admitting: Internal Medicine

## 2021-01-03 ENCOUNTER — Encounter: Payer: Self-pay | Admitting: Internal Medicine

## 2021-01-03 VITALS — BP 118/82 | HR 60 | Ht 66.25 in | Wt 134.0 lb

## 2021-01-03 DIAGNOSIS — Z800 Family history of malignant neoplasm of digestive organs: Secondary | ICD-10-CM

## 2021-01-03 DIAGNOSIS — R634 Abnormal weight loss: Secondary | ICD-10-CM

## 2021-01-03 MED ORDER — SUTAB 1479-225-188 MG PO TABS: 1.0000 | ORAL_TABLET | Freq: Once | ORAL | 0 refills | Status: AC

## 2021-01-03 NOTE — Patient Instructions (Signed)
You have been scheduled for a colonoscopy. Please follow written instructions given to you at your visit today.  Please pick up your prep supplies at the pharmacy within the next 1-3 days. If you use inhalers (even only as needed), please bring them with you on the day of your procedure.   

## 2021-01-03 NOTE — Progress Notes (Signed)
HISTORY OF PRESENT ILLNESS:  [name] is a 55 y.o. 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  Patient has had 40 pound weight loss over the past year.  In part due to dietary change.  She does say that she has had decreased appetite.  She has been seen by her OB/GYN regarding this.  PCP is aware.  She does have a family history of colon cancer in her mother at age 5.  Patient has not had screening colonoscopy.  She denies abdominal pain, GI bleeding, or change in bowel habits.  Review of laboratories from November 26, 2020 shows normal comprehensive metabolic panel with albumin 4.5.  Normal C-reactive protein.  Normal CBC with hemoglobin 14.0.  Normal sedimentation rate to.  Normal thyroid studies.  Normal hemoglobin A1c.  Has recovered from Covid infection.  No relevant imaging  REVIEW OF SYSTEMS:  All non-GI ROS negative unless otherwise stated in the HPI except for night sweats  Past Medical History:  Diagnosis Date  . 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 Constipation   . Dysplasia of cervix, low grade (CIN 1)   . Environmental allergies   . Psoriasis     Past Surgical History:  Procedure Laterality Date  . CESAREAN SECTION    . LAPAROSCOPIC VAGINAL HYSTERECTOMY WITH SALPINGO OOPHORECTOMY Bilateral 03/18/2018   Procedure: LAPAROSCOPIC ASSISTED VAGINAL HYSTERECTOMY WITH BILATERAL SALPINGO OOPHORECTOMY;  Surgeon: Brayton Mars, MD;  Location: [facility] ORS;  Service: Gynecology;  Laterality: Bilateral;  . LAPAROSCOPY    . MANDIBLE FRACTURE SURGERY    . TUBAL LIGATION      Social History [name]  reports that she has never smoked. She has never used smokeless tobacco. She reports current alcohol use. She reports that she does not use drugs.  family history includes Clotting disorder in her maternal grandmother; Colon cancer (age of onset: 51) in her mother;  Esophageal cancer in her father; Heart disease in her maternal grandfather and paternal grandmother; Hypertension in her maternal grandfather; Prostate cancer in her brother; Stroke in her maternal grandmother; Thyroid cancer in her maternal grandmother; Uterine cancer in her mother.  Allergies  Allergen Reactions  . Bactrim [Sulfamethoxazole-Trimethoprim] Rash  . Sulfa Antibiotics Rash       PHYSICAL EXAMINATION: Vital signs: BP 118/82 (BP Location: Left Arm, Patient Position: Sitting, Cuff Size: Normal)   Pulse 60   Ht 5' 6.25" (1.683 m) Comment: height measured without shoes  Wt 134 lb (60.8 kg)   LMP 02/11/2018 (Exact Date)   BMI 21.47 kg/m   Constitutional: generally well-appearing, no acute distress Psychiatric: alert and oriented x3, cooperative Eyes: extraocular movements intact, anicteric, conjunctiva pink Mouth: oral pharynx moist, no lesions Neck: supple no lymphadenopathy Cardiovascular: heart regular rate and rhythm, no murmur Lungs: clear to auscultation bilaterally Abdomen: soft, nontender, nondistended, no obvious ascites, no peritoneal signs, normal bowel sounds, no organomegaly Rectal: Deferred until colonoscopy Extremities: no clubbing, cyanosis, or lower extremity edema bilaterally Skin: no lesions on visible extremities Neuro: No focal deficits.  Cranial nerves intact  ASSESSMENT:  1.  Family history of colon cancer in mother at 23 2.  40 pound weight loss over the past year.  Unremarkable blood work.  No referable symptoms save decreased appetite 3.  History of Covid infection.  Recovered   PLAN:  1.  Screening colonoscopy.The nature of the procedure, as well as the risks, benefits, and alternatives  were carefully and thoroughly reviewed with the patient. Ample time for discussion and questions allowed. The patient understood, was satisfied, and agreed to proceed. 2.  Evaluation of weight loss per PCP discretion, if colonoscopy unrevealing

## 2021-01-13 ENCOUNTER — Other Ambulatory Visit: Payer: Self-pay

## 2021-01-13 ENCOUNTER — Encounter: Payer: Self-pay | Admitting: Internal Medicine

## 2021-01-13 ENCOUNTER — Ambulatory Visit (AMBULATORY_SURGERY_CENTER): Payer: BC Managed Care – PPO | Admitting: Internal Medicine

## 2021-01-13 VITALS — BP 108/70 | HR 63 | Temp 98.1°F | Resp 16 | Ht 66.0 in | Wt 134.0 lb

## 2021-01-13 DIAGNOSIS — Z1211 Encounter for screening for malignant neoplasm of colon: Secondary | ICD-10-CM | POA: Diagnosis not present

## 2021-01-13 DIAGNOSIS — Z800 Family history of malignant neoplasm of digestive organs: Secondary | ICD-10-CM

## 2021-01-13 DIAGNOSIS — D120 Benign neoplasm of cecum: Secondary | ICD-10-CM

## 2021-01-13 MED ORDER — SODIUM CHLORIDE 0.9 % IV SOLN: 500.0000 mL | Freq: Once | INTRAVENOUS | Status: DC

## 2021-01-13 NOTE — Op Note (Signed)
[place] Patient Name: [name] Procedure Date: 01/13/2021 2:28 PM MRN: [id number] Endoscopist: Docia Chuck. Henrene Pastor , MD Age: 55 Referring MD:  Date of Birth: [birth year] Gender: Female Account #: [id number] Procedure:                Colonoscopy with cold snare polypectomy x 1 Indications:              Screening in patient at increased risk: Colorectal                            cancer in mother 18 or older Medicines:                Monitored Anesthesia Care Procedure:                Pre-Anesthesia Assessment:                           - Prior to the procedure, a History and Physical                            was performed, and patient medications and                            allergies were reviewed. The patient's tolerance of                            previous anesthesia was also reviewed. The risks                            and benefits of the procedure and the sedation                            options and risks were discussed with the patient.                            All questions were answered, and informed consent                            was obtained. Prior Anticoagulants: The patient has                            taken no previous anticoagulant or antiplatelet                            agents. ASA Grade Assessment: II - A patient with                            mild systemic disease. After reviewing the risks                            and benefits, the patient was deemed in                            satisfactory condition to undergo the procedure.  After obtaining informed consent, the colonoscope                            was passed under direct vision. Throughout the                            procedure, the patient's blood pressure, pulse, and                            oxygen saturations were monitored continuously. The                            Olympus CF-HQ190 [id number]) Colonoscope was                             introduced through the anus and advanced to the the                            cecum, identified by appendiceal orifice and                            ileocecal valve. The ileocecal valve, appendiceal                            orifice, and rectum were photographed. The quality                            of the bowel preparation was good. The colonoscopy                            was performed without difficulty. The patient                            tolerated the procedure well. The bowel preparation                            used was SUPREP via split dose instruction. Scope In: 2:36:38 PM Scope Out: 2:59:17 PM Scope Withdrawal Time: 0 hours 19 minutes 3 seconds  Total Procedure Duration: 0 hours 22 minutes 39 seconds  Findings:                 A 2 mm polyp was found in the cecum. The polyp was                            removed with a cold snare. Resection and retrieval                            were complete.                           A diffuse area of mild melanosis was found in the                            entire colon. Internal hemorrhoids  noted.                           The exam was otherwise without abnormality on                            direct and retroflexion views. Complications:            No immediate complications. Estimated blood loss:                            None. Estimated Blood Loss:     Estimated blood loss: none. Impression:               - One 2 mm polyp in the cecum, removed with a cold                            snare. Resected and retrieved.                           - Melanosis in the colon. Internal hemorrhoids.                           - The examination was otherwise normal on direct                            and retroflexion views. Recommendation:           - Repeat colonoscopy in 5 years for surveillance                            (family history).                           - Patient has a contact number available for                             emergencies. The signs and symptoms of potential                            delayed complications were discussed with the                            patient. Return to normal activities tomorrow.                            Written discharge instructions were provided to the                            patient.                           - Resume previous diet.                           - Continue present medications.                           -  Await pathology results. Docia Chuck. Henrene Pastor, MD 01/13/2021 3:06:57 PM This report has been signed electronically.

## 2021-01-13 NOTE — Progress Notes (Signed)
Medical history reviewed with no changes noted. VS assessed by C.W 

## 2021-01-13 NOTE — Patient Instructions (Signed)
Thank you for allowing [place] to care for you today!  Await final biopsy of polyp removed, approximately 2 weeks.    Recommend surveillance colonoscopy in 5 years.  Resume previous diet and medications today.  Return to normal daily activities tomorrow, 01/14/2021.  YOU HAD AN ENDOSCOPIC PROCEDURE TODAY AT [facility]:   Refer to the procedure report that was given to you for any specific questions about what was found during the examination.  If the procedure report does not answer your questions, please call your gastroenterologist to clarify.  If you requested that your care partner not be given the details of your procedure findings, then the procedure report has been included in a sealed envelope for you to review at your convenience later.  YOU SHOULD EXPECT: Some feelings of bloating in the abdomen. Passage of more gas than usual.  Walking can help get rid of the air that was put into your GI tract during the procedure and reduce the bloating. If you had a lower endoscopy (such as a colonoscopy or flexible sigmoidoscopy) you may notice spotting of blood in your stool or on the toilet paper. If you underwent a bowel prep for your procedure, you may not have a normal bowel movement for a few days.  Please Note:  You might notice some irritation and congestion in your nose or some drainage.  This is from the oxygen used during your procedure.  There is no need for concern and it should clear up in a day or so.  SYMPTOMS TO REPORT IMMEDIATELY:   Following lower endoscopy (colonoscopy or flexible sigmoidoscopy):  Excessive amounts of blood in the stool  Significant tenderness or worsening of abdominal pains  Swelling of the abdomen that is new, acute  Fever of 100F or higher   Following upper endoscopy (EGD)  Vomiting of blood or coffee ground material  New chest pain or pain under the shoulder blades  Painful or persistently difficult swallowing  New shortness of  breath  Fever of 100F or higher  Black, tarry-looking stools  For urgent or emergent issues, a gastroenterologist can be reached at any hour by calling [phone]. Do not use MyChart messaging for urgent concerns.    DIET:  We do recommend a small meal at first, but then you may proceed to your regular diet.  Drink plenty of fluids but you should avoid alcoholic beverages for 24 hours.  ACTIVITY:  You should plan to take it easy for the rest of today and you should NOT DRIVE or use heavy machinery until tomorrow (because of the sedation medicines used during the test).    FOLLOW UP: Our staff will call the number listed on your records 48-72 hours following your procedure to check on you and address any questions or concerns that you may have regarding the information given to you following your procedure. If we do not reach you, we will leave a message.  We will attempt to reach you two times.  During this call, we will ask if you have developed any symptoms of COVID 19. If you develop any symptoms (ie: fever, flu-like symptoms, shortness of breath, cough etc.) before then, please call [phone].  If you test positive for Covid 19 in the 2 weeks post procedure, please call and report this information to [place].    If any biopsies were taken you will be contacted by phone or by letter within the next 1-3 weeks.  Please call us at [phone]  if you have not heard about the biopsies in 3 weeks.    SIGNATURES/CONFIDENTIALITY: You and/or your care partner have signed paperwork which will be entered into your electronic medical record.  These signatures attest to the fact that that the information above on your After Visit Summary has been reviewed and is understood.  Full responsibility of the confidentiality of this discharge information lies with you and/or your care-partner.

## 2021-01-13 NOTE — Progress Notes (Signed)
Called to room to assist during endoscopic procedure.  Patient ID and intended procedure confirmed with present staff. Received instructions for my participation in the procedure from the performing physician.  

## 2021-01-13 NOTE — Progress Notes (Signed)
Pt Drowsy. VSS. To PACU, report to RN. No anesthetic complications noted.  

## 2021-01-16 ENCOUNTER — Telehealth: Payer: Self-pay | Admitting: Family Medicine

## 2021-01-16 NOTE — Telephone Encounter (Signed)
Patient has a history of weight loss.  Please get her an appointment scheduled to discuss this.  I saw that she had a colonoscopy done recently-I thought it made sense to get that done before coming in for an office visit.  Thanks.

## 2021-01-17 ENCOUNTER — Telehealth: Payer: Self-pay

## 2021-01-17 NOTE — Telephone Encounter (Signed)
  Follow up Call-  Call back number 01/13/2021  Post procedure Call Back phone  # [phone]  Permission to leave phone message Yes  Some recent data might be hidden     Patient questions:  Do you have a fever, pain , or abdominal swelling? No. Pain Score  0 *  Have you tolerated food without any problems? Yes.    Have you been able to return to your normal activities? Yes.    Do you have any questions about your discharge instructions: Diet   No. Medications  No. Follow up visit  No.  Do you have questions or concerns about your Care? No.  Actions: * If pain score is 4 or above: No action needed, pain <4. 1. Have you developed a fever since your procedure? no  2.   Have you had an respiratory symptoms (SOB or cough) since your procedure? no  3.   Have you tested positive for COVID 19 since your procedure no  4.   Have you had any family members/close contacts diagnosed with the COVID 19 since your procedure?  no   If yes to any of these questions please route to Joylene John, RN and Joella Prince, RN

## 2021-01-17 NOTE — Telephone Encounter (Signed)
Please call patient and schedule an appointment as instructed. 

## 2021-01-17 NOTE — Telephone Encounter (Signed)
Called pt and she said she is fine and if something changes she will make an appointment.

## 2021-01-18 NOTE — Telephone Encounter (Signed)
Noted. Thanks.

## 2021-01-21 ENCOUNTER — Encounter: Payer: Self-pay | Admitting: Internal Medicine

## 2021-10-11 ENCOUNTER — Other Ambulatory Visit: Payer: Self-pay

## 2021-10-11 ENCOUNTER — Telehealth (INDEPENDENT_AMBULATORY_CARE_PROVIDER_SITE_OTHER): Payer: BC Managed Care – PPO | Admitting: Nurse Practitioner

## 2021-10-11 ENCOUNTER — Encounter: Payer: Self-pay | Admitting: Nurse Practitioner

## 2021-10-11 VITALS — BP 104/66 | HR 66 | Temp 97.9°F

## 2021-10-11 DIAGNOSIS — U071 COVID-19: Secondary | ICD-10-CM | POA: Diagnosis not present

## 2021-10-11 MED ORDER — MOLNUPIRAVIR EUA 200MG CAPSULE: 4.0000 | ORAL_CAPSULE | Freq: Two times a day (BID) | ORAL | 0 refills | Status: AC

## 2021-10-11 NOTE — Progress Notes (Signed)
Patient ID: [name], female    DOB: 05-06-1966, 56 y.o.   MRN: [id number]  Virtual visit completed through [employer], a video enabled telemedicine application. Due to national recommendations of social distancing due to COVID-19, a virtual visit is felt to be most appropriate for this patient at this time. Reviewed limitations, risks, security and privacy concerns of performing a virtual visit and the availability of in person appointments. I also reviewed that there may be a patient responsible charge related to this service. The patient agreed to proceed.   Patient location: home Provider location:  at [facility], office Persons participating in this virtual visit: patient, provider   If any vitals were documented, they were collected by patient at home unless specified below.    BP 104/66    Pulse 66    Temp 97.9 F (36.6 C) Comment: per patient this morning vitals   LMP 02/11/2018 (Exact Date)    CC: Covid 19 Subjective:   HPI: [name] is a 56 y.o. female presenting on 10/11/2021 for Covid Positive (On 10/10/21, sx started on 10/10/21-sore throat, achy, 99.5 temp last night, headache, feel hot, congested. 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 Tylenol otc with some relief       Relevant past medical, surgical, family and social history reviewed and updated as indicated. Interim medical history since our last visit reviewed. Allergies and medications reviewed and updated. Outpatient Medications Prior to Visit  Medication Sig Dispense Refill   albuterol (VENTOLIN HFA) 108 (90 Base) MCG/ACT inhaler INHALE 2 PUFFS BY MOUTH EVERY 6 HOURS AS NEEDED FOR WHEEZE /BREATH SHORTNESS (USE PRIOR TO EXERCISE) 8.5 Inhaler 1   clotrimazole-betamethasone (LOTRISONE) cream Apply 1 application topically 2 (two) times daily. 30 g 0   No  facility-administered medications prior to visit.     Per HPI unless specifically indicated in ROS section below Review of Systems  Constitutional:  Positive for appetite change, chills, fatigue and fever.  HENT:  Positive for congestion, sinus pressure, sinus pain, sore throat and tinnitus. Negative for ear discharge and ear pain.   Respiratory:  Negative for cough and shortness of breath.   Cardiovascular:  Negative for chest pain.  Gastrointestinal:  Positive for nausea (during the night). Negative for abdominal pain, diarrhea and vomiting.  Musculoskeletal:  Positive for arthralgias, back pain and myalgias.  Neurological:  Positive for headaches. Negative for dizziness and light-headedness.  Objective:  BP 104/66    Pulse 66    Temp 97.9 F (36.6 C) Comment: per patient this morning vitals   LMP 02/11/2018 (Exact Date)   Wt Readings from Last 3 Encounters:  01/13/21 134 lb (60.8 kg)  01/03/21 134 lb (60.8 kg)  11/26/20 134 lb (60.8 kg)       Physical exam: Gen: alert, NAD, not ill appearing Pulm: speaks in complete sentences without increased work of breathing Psych: normal mood, normal thought content      Results for orders placed or performed in visit on 11/23/20  CBC with Differential/Platelet  Result Value Ref Range   WBC 5.9 3.4 - 10.8 x10E3/uL   RBC 4.79 3.77 - 5.28 x10E6/uL   Hemoglobin 14.0 11.1 - 15.9 g/dL   Hematocrit 02.5 62.7 - 46.6 %   MCV 90 79 - 97 fL   MCH 29.2 26.6 - 33.0 pg   MCHC  32.3 31.5 - 35.7 g/dL   RDW 12.7 11.7 - 15.4 %   Platelets 240 150 - 450 x10E3/uL   Neutrophils 58 Not Estab. %   Lymphs 30 Not Estab. %   Monocytes 8 Not Estab. %   Eos 3 Not Estab. %   Basos 1 Not Estab. %   Neutrophils Absolute 3.5 1.4 - 7.0 x10E3/uL   Lymphocytes Absolute 1.8 0.7 - 3.1 x10E3/uL   Monocytes Absolute 0.5 0.1 - 0.9 x10E3/uL   EOS (ABSOLUTE) 0.2 0.0 - 0.4 x10E3/uL   Basophils Absolute 0.1 0.0 - 0.2 x10E3/uL   Immature Granulocytes 0 Not Estab. %    Immature Grans (Abs) 0.0 0.0 - 0.1 x10E3/uL  Comprehensive metabolic panel  Result Value Ref Range   Glucose 75 65 - 99 mg/dL   BUN 12 6 - 24 mg/dL   Creatinine, Ser 0.71 0.57 - 1.00 mg/dL   eGFR 102 >59 mL/min/1.73   BUN/Creatinine Ratio 17 9 - 23   Sodium 143 134 - 144 mmol/L   Potassium 4.1 3.5 - 5.2 mmol/L   Chloride 104 96 - 106 mmol/L   CO2 24 20 - 29 mmol/L   Calcium 9.3 8.7 - 10.2 mg/dL   Total Protein 6.8 6.0 - 8.5 g/dL   Albumin 4.5 3.8 - 4.9 g/dL   Globulin, Total 2.3 1.5 - 4.5 g/dL   Albumin/Globulin Ratio 2.0 1.2 - 2.2   Bilirubin Total 0.4 0.0 - 1.2 mg/dL   Alkaline Phosphatase 60 44 - 121 IU/L   AST 17 0 - 40 IU/L   ALT 17 0 - 32 IU/L  Hemoglobin A1c  Result Value Ref Range   Hgb A1c MFr Bld 5.1 4.8 - 5.6 %   Est. average glucose Bld gHb Est-mCnc 100 mg/dL  Thyroid Panel With TSH  Result Value Ref Range   TSH 2.560 0.450 - 4.500 uIU/mL   T4, Total 6.8 4.5 - 12.0 ug/dL   T3 Uptake Ratio 27 24 - 39 %   Free Thyroxine Index 1.8 1.2 - 4.9  Sed Rate (ESR)  Result Value Ref Range   Sed Rate 2 0 - 40 mm/hr  C-reactive protein  Result Value Ref Range   CRP <1 0 - 10 mg/L  Hepatitis C Antibody  Result Value Ref Range   Hep C Virus Ab <0.1 0.0 - 0.9 s/co ratio   Assessment & Plan:   Problem List Items Addressed This Visit       Other   COVID-19 - Primary    Patient tested positive for COVID-19.  Did have discussion regarding antiviral medications and that they are EUA only.  After joint discussion patient elected to proceed with antiviral medication.  Did discuss signs and symptoms in regard to antiviral medication.  Also discussed CDC guidelines and recommendations in regards to quarantine.  Did discuss signs and symptoms of when to seek urgent or emergent health care.  Start molnupiravir as soon as possible      Relevant Medications   molnupiravir EUA (LAGEVRIO) 200 mg CAPS capsule     Meds ordered this encounter  Medications   molnupiravir EUA  (LAGEVRIO) 200 mg CAPS capsule    Sig: Take 4 capsules (800 mg total) by mouth 2 (two) times daily for 5 days.    Dispense:  40 capsule    Refill:  0    Order Specific Question:   Supervising Provider    Answer:   TOWER, MARNE A [[id number]]   No orders of  the defined types were placed in this encounter.   I discussed the assessment and treatment plan with the patient. The patient was provided an opportunity to ask questions and all were answered. The patient agreed with the plan and demonstrated an understanding of the instructions. The patient was advised to call back or seek an in-person evaluation if the symptoms worsen or if the condition fails to improve as anticipated.  Follow up plan: No follow-ups on file.  Romilda Garret, NP

## 2021-10-11 NOTE — Assessment & Plan Note (Signed)
Patient tested positive for COVID-19.  Did have discussion regarding antiviral medications and that they are EUA only.  After joint discussion patient elected to proceed with antiviral medication.  Did discuss signs and symptoms in regard to antiviral medication.  Also discussed CDC guidelines and recommendations in regards to quarantine.  Did discuss signs and symptoms of when to seek urgent or emergent health care.  Start molnupiravir as soon as possible

## 2022-06-05 ENCOUNTER — Encounter: Payer: Self-pay | Admitting: Family

## 2022-06-05 ENCOUNTER — Telehealth (INDEPENDENT_AMBULATORY_CARE_PROVIDER_SITE_OTHER): Payer: BC Managed Care – PPO | Admitting: Family

## 2022-06-05 VITALS — Temp 98.3°F | Ht 66.0 in | Wt 128.0 lb

## 2022-06-05 DIAGNOSIS — U071 COVID-19: Secondary | ICD-10-CM | POA: Insufficient documentation

## 2022-06-05 DIAGNOSIS — J4530 Mild persistent asthma, uncomplicated: Secondary | ICD-10-CM | POA: Diagnosis not present

## 2022-06-05 MED ORDER — MOLNUPIRAVIR EUA 200MG CAPSULE: 4.0000 | ORAL_CAPSULE | Freq: Two times a day (BID) | ORAL | 0 refills | Status: AC

## 2022-06-05 MED ORDER — ALBUTEROL SULFATE HFA 108 (90 BASE) MCG/ACT IN AERS: INHALATION_SPRAY | RESPIRATORY_TRACT | 1 refills | Status: DC

## 2022-06-05 NOTE — Progress Notes (Signed)
f.   MyChart Video Visit    Virtual Visit via Video Note   This visit type was conducted due to national recommendations for restrictions regarding the COVID-19 Pandemic (e.g. social distancing) in an effort to limit this patient's exposure and mitigate transmission in our community. This patient is at least at moderate risk for complications without adequate follow up. This format is felt to be most appropriate for this patient at this time. Physical exam was limited by quality of the video and audio technology used for the visit. CMA was able to get the patient set up on a video visit.  Patient location: Home. Patient and provider in visit Provider location: Office  I discussed the limitations of evaluation and management by telemedicine and the availability of in person appointments. The patient expressed understanding and agreed to proceed.  Visit Date: 06/05/2022  Today's healthcare provider: Eugenia Pancoast, FNP     Subjective:    Patient ID: [name], female    DOB: 02-Dec-1965, 56 y.o.   MRN: [id number]  Chief Complaint  Patient presents with  . Covid Positive    Home test positive last night. Sore throat and body aches started yesterday.     HPI  Pt here today via video visit with concerns.   Last night tested positive for covid .  Symptoms started early yesterday.  C/o sore throat, body aches, fatigue.  No fever or chills. No cough or chest congestion. No sob. With nasal congestion.   Third time having covid.  Son was positive with covid last week.  Past Medical History:  Diagnosis Date  . 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 Constipation   . Dysplasia of cervix, low grade (CIN 1)   . Environmental allergies   . Psoriasis     Past Surgical History:  Procedure Laterality Date  . CESAREAN SECTION    . LAPAROSCOPIC VAGINAL HYSTERECTOMY WITH SALPINGO OOPHORECTOMY Bilateral 03/18/2018   Procedure: LAPAROSCOPIC ASSISTED VAGINAL  HYSTERECTOMY WITH BILATERAL SALPINGO OOPHORECTOMY;  Surgeon: Brayton Mars, MD;  Location: [facility] ORS;  Service: Gynecology;  Laterality: Bilateral;  . LAPAROSCOPY    . MANDIBLE FRACTURE SURGERY    . TUBAL LIGATION      Family History  Problem Relation Age of Onset  . Heart disease Maternal Grandfather   . Hypertension Maternal Grandfather   . Uterine cancer Mother   . Colon cancer Mother 55  . Esophageal cancer Father   . Prostate cancer Brother   . Stroke Maternal Grandmother   . Thyroid cancer Maternal Grandmother   . Clotting disorder Maternal Grandmother   . Heart disease Paternal Grandmother   . Breast cancer Neg Hx   . Ovarian cancer Neg Hx   . Diabetes Neg Hx     Social History   Socioeconomic History  . Marital status: Married    Spouse name: Not on file  . Number of children: 3  . Years of education: Not on file  . Highest education level: Not on file  Occupational History  . Occupation: Associate Professor  Tobacco Use  . Smoking status: Never  . Smokeless tobacco: Never  Vaping Use  . Vaping Use: Never used  Substance and Sexual Activity  . Alcohol use: Yes    Alcohol/week: 0.0 standard drinks of alcohol    Comment: rarely  . Drug use: No  . Sexual activity: Yes    Birth control/protection: Surgical  Other Topics Concern  . Not on file  Social History Narrative  . Not on file   Social Determinants of Health   Financial Resource Strain: Not on file  Food Insecurity: Not on file  Transportation Needs: Not on file  Physical Activity: Not on file  Stress: Not on file  Social Connections: Not on file  Intimate Partner Violence: Not on file    Outpatient Medications Prior to Visit  Medication Sig Dispense Refill  . albuterol (VENTOLIN HFA) 108 (90 Base) MCG/ACT inhaler INHALE 2 PUFFS BY MOUTH EVERY 6 HOURS AS NEEDED FOR WHEEZE /BREATH SHORTNESS (USE PRIOR TO EXERCISE) 8.5 Inhaler 1  . clotrimazole-betamethasone (LOTRISONE)  cream Apply 1 application topically 2 (two) times daily. 30 g 0   No facility-administered medications prior to visit.    Allergies  Allergen Reactions  . Bactrim [Sulfamethoxazole-Trimethoprim] Rash  . Sulfa Antibiotics Rash         Objective:    Physical Exam Constitutional:      General: She is not in acute distress.    Appearance: Normal appearance. She is not ill-appearing.  HENT:     Nose: No congestion or rhinorrhea.     Right Turbinates: Not enlarged or swollen.     Left Turbinates: Not enlarged or swollen.     Right Sinus: No maxillary sinus tenderness or frontal sinus tenderness.     Left Sinus: No maxillary sinus tenderness or frontal sinus tenderness.     Mouth/Throat:     Pharynx: No pharyngeal swelling, oropharyngeal exudate or posterior oropharyngeal erythema.     Tonsils: No tonsillar exudate.  Neck:     Thyroid: No thyroid mass.  Pulmonary:     Effort: Pulmonary effort is normal.  Lymphadenopathy:     Cervical:     Right cervical: No superficial cervical adenopathy.    Left cervical: No superficial cervical adenopathy.  Neurological:     General: No focal deficit present.     Mental Status: She is alert and oriented to person, place, and time. Mental status is at baseline.  Psychiatric:        Mood and Affect: Mood normal.    Temp 98.3 F (36.8 C) (Temporal)   Ht '5\' 6"'$  (1.676 m)   Wt 128 lb (58.1 kg) Comment: per patient  LMP 02/11/2018   BMI 20.66 kg/m  Wt Readings from Last 3 Encounters:  06/05/22 128 lb (58.1 kg)  01/13/21 134 lb (60.8 kg)  01/03/21 134 lb (60.8 kg)       Assessment & Plan:   Problem List Items Addressed This Visit       Respiratory   Asthma    Currently asymptomatic, refilled albuterol inhaler prn      Relevant Medications   albuterol (VENTOLIN HFA) 108 (90 Base) MCG/ACT inhaler     Other   Acute COVID-19 - Primary    Advised of CDC guidelines for self isolation/ ending isolation.  Advised of safe practice  guidelines. Symptom Tier reviewed.  Encouraged to monitor for any worsening symptoms; watch for increased shortness of breath, weakness, and signs of dehydration. Advised when to seek emergency care.  Instructed to rest and hydrate well.  Advised to leave the house during recommended isolation period, only if it is necessary to seek medical care  Pt considered high risk for hospitalization. have decided pt is a candidate for antiviral and pt agrees that she would like to take this. I have sent in RX for molnupiravir 200 mg capsules to be taken as directed.  Relevant Medications   molnupiravir EUA (LAGEVRIO) 200 mg CAPS capsule   albuterol (VENTOLIN HFA) 108 (90 Base) MCG/ACT inhaler    I have discontinued Sharnika L. Bollen's clotrimazole-betamethasone. I am also having her start on molnupiravir EUA. Additionally, I am having her maintain her albuterol.  Meds ordered this encounter  Medications  . molnupiravir EUA (LAGEVRIO) 200 mg CAPS capsule    Sig: Take 4 capsules (800 mg total) by mouth 2 (two) times daily for 5 days.    Dispense:  40 capsule    Refill:  0    Order Specific Question:   Supervising Provider    Answer:   BEDSOLE, AMY E [[id number]]  . albuterol (VENTOLIN HFA) 108 (90 Base) MCG/ACT inhaler    Sig: INHALE 2 PUFFS BY MOUTH EVERY 6 HOURS AS NEEDED FOR WHEEZE /BREATH SHORTNESS (USE PRIOR TO EXERCISE)    Dispense:  8.5 each    Refill:  1    Order Specific Question:   Supervising Provider    Answer:   BEDSOLE, AMY E [[id number]]    I discussed the assessment and treatment plan with the patient. The patient was provided an opportunity to ask questions and all were answered. The patient agreed with the plan and demonstrated an understanding of the instructions.   The patient was advised to call back or seek an in-person evaluation if the symptoms worsen or if the condition fails to improve as anticipated.  I provided 15 minutes of face-to-face time during this  encounter.   Eugenia Pancoast, [place] at [place] [phone] (phone) [phone] (fax)  [place]

## 2022-06-05 NOTE — Assessment & Plan Note (Signed)
Advised of CDC guidelines for self isolation/ ending isolation.  Advised of safe practice guidelines. Symptom Tier reviewed.  Encouraged to monitor for any worsening symptoms; watch for increased shortness of breath, weakness, and signs of dehydration. Advised when to seek emergency care.  Instructed to rest and hydrate well.  Advised to leave the house during recommended isolation period, only if it is necessary to seek medical care Pt considered high risk for hospitalization. have decided pt is a candidate for antiviral and pt agrees that she would like to take this. I have sent in RX for molnupiravir 200 mg capsules to be taken as directed.  

## 2022-06-05 NOTE — Assessment & Plan Note (Signed)
Currently asymptomatic, refilled albuterol inhaler prn

## 2022-06-05 NOTE — Patient Instructions (Signed)
Advised of CDC guidelines for self isolation/ ending isolation.  Advised of safe practice guidelines. Symptom Tier reviewed.  Encouraged to monitor for any worsening symptoms; watch for increased shortness of breath, weakness, and signs of dehydration. Advised when to seek emergency care.  Instructed to rest and hydrate well.  Advised to leave the house during recommended isolation period, only if it is necessary to seek medical care    Regards,   Eugenia Pancoast FNP-C

## 2022-08-11 ENCOUNTER — Other Ambulatory Visit: Payer: Self-pay | Admitting: Family

## 2022-08-11 DIAGNOSIS — J4530 Mild persistent asthma, uncomplicated: Secondary | ICD-10-CM

## 2022-08-11 DIAGNOSIS — U071 COVID-19: Secondary | ICD-10-CM

## 2022-10-15 ENCOUNTER — Ambulatory Visit (HOSPITAL_COMMUNITY)
Admission: RE | Admit: 2022-10-15 | Discharge: 2022-10-15 | Disposition: A | Discharge status: Home / Self Care | Payer: BC Managed Care – PPO | Source: Ambulatory Visit | Attending: Internal Medicine | Admitting: Internal Medicine

## 2022-10-15 ENCOUNTER — Encounter (HOSPITAL_COMMUNITY): Payer: Self-pay

## 2022-10-15 ENCOUNTER — Other Ambulatory Visit: Payer: Self-pay

## 2022-10-15 VITALS — BP 126/82 | HR 79 | Temp 99.5°F | Resp 18

## 2022-10-15 DIAGNOSIS — J029 Acute pharyngitis, unspecified: Secondary | ICD-10-CM | POA: Insufficient documentation

## 2022-10-15 DIAGNOSIS — J069 Acute upper respiratory infection, unspecified: Secondary | ICD-10-CM | POA: Insufficient documentation

## 2022-10-15 DIAGNOSIS — Z1152 Encounter for screening for COVID-19: Secondary | ICD-10-CM | POA: Diagnosis not present

## 2022-10-15 LAB — POCT RAPID STREP A, ED / UC: Streptococcus, Group A Screen (Direct): NEGATIVE

## 2022-10-15 MED ORDER — BENZONATATE 100 MG PO CAPS: 100.0000 mg | ORAL_CAPSULE | Freq: Three times a day (TID) | ORAL | 0 refills | Status: DC
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
## 2022-10-15 NOTE — ED Triage Notes (Signed)
Pt reports cough ,sore throat,body achesand chills. Sx's started on Thursday. Pt is a Education officer, museum.

## 2022-10-15 NOTE — Discharge Instructions (Signed)
You have a viral upper respiratory infection.  Strep testing is negative, throat culture is pending. COVID-19 testing is pending. We will call you with results if positive. If your COVID test is positive, you must stay at home until day 6 of symptoms. On day 6, you may go out into public and go back to work, but you must wear a mask until day 11 of symptoms to prevent spread to others.  Use the following medicines to help with symptoms: - Plain Mucinex (guaifenesin) over the counter as directed every 12 hours to thin mucous so that you are able to get it out of your body easier. Drink plenty of water while taking this medication so that it works well in your body (at least 8 cups a day).  - Tylenol 1,'000mg'$  and/or ibuprofen '600mg'$  every 6 hours with food as needed for aches/pains or fever/chills.  - Tessalon perles every 8 hours as needed for cough.  1 tablespoon of honey in warm water and/or salt water gargles may also help with symptoms. Humidifier to your room will help add water to the air and reduce coughing.  If you develop any new or worsening symptoms, please return.  If your symptoms are severe, please go to the emergency room.  Follow-up with your primary care provider for further evaluation and management of your symptoms as well as ongoing wellness visits.  I hope you feel better!

## 2022-10-16 LAB — SARS CORONAVIRUS 2 (TAT 6-24 HRS): SARS Coronavirus 2: NEGATIVE

## 2022-10-18 LAB — CULTURE, GROUP A STREP (THRC)

## 2023-04-27 ENCOUNTER — Telehealth (INDEPENDENT_AMBULATORY_CARE_PROVIDER_SITE_OTHER): Payer: BC Managed Care – PPO | Admitting: Internal Medicine

## 2023-04-27 ENCOUNTER — Encounter: Payer: Self-pay | Admitting: Internal Medicine

## 2023-04-27 DIAGNOSIS — U071 COVID-19: Secondary | ICD-10-CM | POA: Diagnosis not present

## 2023-04-27 NOTE — Assessment & Plan Note (Signed)
Mild infection with no risk factors (though no vaccines in years) Multiple prior infections She asks about the mulnupiravir she had in the past--discussed that is not recommended. Also paxlovid is not now recommended for low risk patients with mild infections. She agrees to forgo Rx for now Supportive Rx with analgesics (ibuprofen 400 or two tylenol tid prn) Can use OTC cough med prn Isolate this week Can go to work next week if mask if feels back to normal ER evaluation if SOB or severe worsening

## 2023-04-27 NOTE — Progress Notes (Signed)
Subjective:    Patient ID: [name], female    DOB: 11-03-1965, 57 y.o.   MRN: [id number]  HPI Video virtual visit due to COVID infection Identification done Reviewed limitations and billing and she gave consent Participants---patient in her home and I am in my office  2 nights ago---noticed a scratchy throat and congestion Took ibuprofen--but then had increasing achiness and lack of energy Tested yesterday morning--positive  Fever last night-low grade. Again took ibuprofen 200 and it helped Some headache--mild Slight cough--mostly to clear out congestion No SOB  Current Outpatient Medications on File Prior to Visit  Medication Sig Dispense Refill   albuterol (VENTOLIN HFA) 108 (90 Base) MCG/ACT inhaler INHALE 2 PUFFS BY MOUTH EVERY 6 HOURS AS NEEDED FOR WHEEZE /BREATH SHORTNESS (USE PRIOR TO EXERCISE) 8.5 each 1   No current facility-administered medications on file prior to visit.    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   Constipation    Dysplasia of cervix, low grade (CIN 1)    Environmental allergies    Psoriasis     Past Surgical History:  Procedure Laterality Date   CESAREAN SECTION     LAPAROSCOPIC VAGINAL HYSTERECTOMY WITH SALPINGO OOPHORECTOMY Bilateral 03/18/2018   Procedure: LAPAROSCOPIC ASSISTED VAGINAL HYSTERECTOMY WITH BILATERAL SALPINGO OOPHORECTOMY;  Surgeon: Herold Harms, MD;  Location: [facility];  Service: Gynecology;  Laterality: Bilateral;   LAPAROSCOPY     MANDIBLE FRACTURE SURGERY     TUBAL LIGATION      Family History  Problem Relation Age of Onset   Heart disease Maternal Grandfather    Hypertension Maternal Grandfather    Uterine cancer Mother    Colon cancer Mother 49   Esophageal cancer Father    Prostate cancer Brother    Stroke Maternal Grandmother     Thyroid cancer Maternal Grandmother    Clotting disorder Maternal Grandmother    Heart disease Paternal Grandmother    Breast cancer Neg Hx    Ovarian cancer Neg Hx    Diabetes Neg Hx     Social History   Socioeconomic History   Marital status: Married    Spouse name: Not on file   Number of children: 3   Years of education: Not on file   Highest education level: Not on file  Occupational History   Occupation: Ecologist  Tobacco Use   Smoking status: Never   Smokeless tobacco: Never  Vaping Use   Vaping status: Never Used  Substance and Sexual Activity   Alcohol use: Yes    Alcohol/week: 0.0 standard drinks of alcohol    Comment: rarely   Drug use: No   Sexual activity: Yes    Birth control/protection: Surgical  Other Topics Concern   Not on file  Social History Narrative   Not on file   Social Determinants of Health   Financial Resource Strain: Not on file  Food Insecurity: Not on file  Transportation Needs: Not on file  Physical Activity: Not on file  Stress: Not on file  Social Connections: Not on file  Intimate Partner Violence: Not on file   Review of Systems No change in smell or taste No N/V Eating some     Objective:   Physical Exam Constitutional:      Appearance: Normal appearance.  Pulmonary:     Effort:  Pulmonary effort is normal. No respiratory distress.  Neurological:     Mental Status: She is alert.            Assessment & Plan:

## 2024-04-11 ENCOUNTER — Encounter: Payer: Self-pay | Admitting: Advanced Practice Midwife

## 2024-04-22 ENCOUNTER — Ambulatory Visit (INDEPENDENT_AMBULATORY_CARE_PROVIDER_SITE_OTHER): Admitting: Family Medicine

## 2024-04-22 ENCOUNTER — Encounter: Payer: Self-pay | Admitting: Family Medicine

## 2024-04-22 VITALS — BP 98/60 | HR 68 | Temp 98.0°F | Resp 20 | Ht 66.0 in | Wt 132.0 lb

## 2024-04-22 DIAGNOSIS — L409 Psoriasis, unspecified: Secondary | ICD-10-CM | POA: Diagnosis not present

## 2024-04-22 DIAGNOSIS — J4530 Mild persistent asthma, uncomplicated: Secondary | ICD-10-CM | POA: Diagnosis not present

## 2024-04-22 DIAGNOSIS — U071 COVID-19: Secondary | ICD-10-CM

## 2024-04-22 MED ORDER — ALBUTEROL SULFATE HFA 108 (90 BASE) MCG/ACT IN AERS: INHALATION_SPRAY | RESPIRATORY_TRACT | Status: DC

## 2024-04-22 MED ORDER — CLOBETASOL PROPIONATE 0.05 % EX CREA: 1.0000 | TOPICAL_CREAM | Freq: Two times a day (BID) | CUTANEOUS | 0 refills | Status: DC

## 2024-04-22 NOTE — Progress Notes (Unsigned)
 She isn't lightheaded on standing.  She is still walking, exercising at baseline.  Healthy diet.    Clearly demarcated rash on the L lower shin.  Waxes and wanes with size/symptoms.  No clear trigger.  Going on for years.    I asked her to schedule routine yearly visit when possible.  Meds, vitals, and allergies reviewed.   ROS: Per HPI unless specifically indicated in ROS section   Nad Nact Skin well perfused.  24 cm vertical diameter of maculopapular rash, well demarcated, blanching pink rash.  Spares the sole.  Not completely circumferential.  No ulceration.

## 2024-04-22 NOTE — Patient Instructions (Signed)
 Use clobetasol  on the rash twice a day and update me next week.  Don't use it on unaffected skin.  Take care.  Glad to see you.

## 2024-04-23 NOTE — Assessment & Plan Note (Signed)
 This could be a chronic psoriatic plaque or another steroid responsive dermatitis.  Discussed options.  It does not appear to be actively infected.  No ulceration.  Intact distal pulse. Use clobetasol  on the rash twice a day and update me next week.  Don't use it on unaffected skin.  She agrees with plan.

## 2024-04-29 ENCOUNTER — Other Ambulatory Visit: Payer: Self-pay

## 2024-04-29 ENCOUNTER — Encounter: Payer: Self-pay | Admitting: Family Medicine

## 2024-04-29 DIAGNOSIS — J4530 Mild persistent asthma, uncomplicated: Secondary | ICD-10-CM

## 2024-04-29 DIAGNOSIS — U071 COVID-19: Secondary | ICD-10-CM

## 2024-04-29 MED ORDER — ALBUTEROL SULFATE HFA 108 (90 BASE) MCG/ACT IN AERS: INHALATION_SPRAY | RESPIRATORY_TRACT | 4 refills | Status: AC

## 2024-05-02 ENCOUNTER — Ambulatory Visit
Admission: RE | Admit: 2024-05-02 | Discharge: 2024-05-02 | Disposition: A | Discharge status: Home / Self Care | Attending: Family Medicine | Admitting: Family Medicine

## 2024-05-02 ENCOUNTER — Telehealth: Payer: Self-pay | Admitting: Family Medicine

## 2024-05-02 VITALS — BP 124/78 | HR 78 | Temp 97.0°F

## 2024-05-02 DIAGNOSIS — R21 Rash and other nonspecific skin eruption: Secondary | ICD-10-CM

## 2024-05-02 DIAGNOSIS — L2989 Other pruritus: Secondary | ICD-10-CM | POA: Diagnosis not present

## 2024-05-02 MED ORDER — PREDNISONE 10 MG (21) PO TBPK: ORAL_TABLET | Freq: Every day | ORAL | 0 refills | Status: DC

## 2024-05-02 MED ORDER — METHYLPREDNISOLONE SODIUM SUCC 125 MG IJ SOLR
125.0000 mg | Freq: Once | INTRAMUSCULAR | Status: AC
  Administered 2024-05-02: 125 mg via INTRAMUSCULAR

## 2024-05-02 NOTE — Discharge Instructions (Addendum)
 Advised patient take medication as directed with food to completion.  Encouraged to increase daily water intake to 64 ounces per day while taking this medication.  Advised if symptoms worsen and/or unresolved please follow-up with your PCP or [facility] dermatology for further evaluation.

## 2024-05-02 NOTE — Telephone Encounter (Signed)
 Please triage patient about rash and possible infection.  Thanks.

## 2024-05-02 NOTE — ED Triage Notes (Signed)
 Pt states she has had a rash on her left foot for years, she has never seen dermatology but states her PCP thought it was psoriasis. She has used topical steroids with no relief and states this flare up is the worst it has ever been. C/o itching and burning.

## 2024-05-02 NOTE — Telephone Encounter (Signed)
 Agree. Thanks

## 2024-05-02 NOTE — Telephone Encounter (Signed)
 I spoke with pt;pt said the rash on lt ankle and lt lower leg has not spread but the itching has worsened and the five angry red spots the size of the end of a pencil eraser are also hurting. One of the red spots has a yellow drainage. Pt has no fever and no red streaks.. Pt is presently at work in [place] and no [facility] office has afternoon appts today.. I scheduled pt an appt at [facility]  in [place] for 05/02/24 at 4:30 pm.pt voiced understanding and appreciative.Sending FYI to Dr Cleatus.
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
## 2024-05-06 ENCOUNTER — Other Ambulatory Visit: Payer: Self-pay | Admitting: Family Medicine

## 2024-05-06 ENCOUNTER — Ambulatory Visit: Payer: Self-pay

## 2024-05-06 DIAGNOSIS — R21 Rash and other nonspecific skin eruption: Secondary | ICD-10-CM

## 2024-05-06 MED ORDER — DOXYCYCLINE HYCLATE 100 MG PO TABS: 100.0000 mg | ORAL_TABLET | Freq: Two times a day (BID) | ORAL | 0 refills | Status: AC

## 2024-05-06 NOTE — Telephone Encounter (Signed)
 FYI Only or Action Required?: Action required by provider: medication refill request and referral request.  Patient was last seen in primary care on 04/22/2024 by Cleatus Arlyss RAMAN, MD.  Called Nurse Triage reporting Rash.  Symptoms began several months ago.  Interventions attempted: Prescription medications: cream.  Symptoms are: gradually worsening.  Triage Disposition: See Physician Within 24 Hours  Patient/caregiver understands and will follow disposition?: No, wishes to speak with PCP  Copied from CRM #[id number]. Topic: Clinical - Red Word Triage >> May 06, 2024  8:02 AM Berneda FALCON wrote: Red Word that prompted transfer to Nurse Triage: Patient states that she saw Dr. Cleatus last week about a rash on her left leg/ankle area and he prescribed her a cream to help. She states it has worsened since then and is now also very painful. Reason for Disposition  [1] Localized rash is very painful AND [2] no fever  Answer Assessment - Initial Assessment Questions 1. APPEARANCE of RASH: What does the rash look like? (e.g., blisters, dry flaky skin, red spots, redness, sores)     I have sent pictures 2. LOCATION: Where is the rash located?      L leg  5. ONSET: When did the rash start?      10 years intermittent, was given steroid last week 6. ITCHING: Does the rash itch? If Yes, ask: How bad is the itch?  (Scale 0-10; or none, mild, moderate, severe)     Does not itch anymore, just hurts 7. PAIN: Does the rash hurt? If Yes, ask: How bad is the pain?  (Scale 0-10; or none, mild, moderate, severe)     10 8. OTHER SYMPTOMS: Do you have any other symptoms? (e.g., fever)     Denies  Pt refused appts, states that she does not want to come in. 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 Pt states that she just wants meds and referral to derm.  Protocols used: Rash or Redness - Localized-A-AH

## 2024-05-06 NOTE — Telephone Encounter (Signed)
 See my chart message

## 2024-05-09 ENCOUNTER — Encounter: Payer: Self-pay | Admitting: Primary Care

## 2024-05-09 ENCOUNTER — Encounter: Payer: Self-pay | Admitting: *Deleted

## 2024-05-09 ENCOUNTER — Ambulatory Visit: Admitting: Primary Care

## 2024-05-09 VITALS — BP 122/76 | HR 56 | Temp 97.7°F | Ht 66.0 in | Wt 133.0 lb

## 2024-05-09 DIAGNOSIS — R21 Rash and other nonspecific skin eruption: Secondary | ICD-10-CM | POA: Diagnosis not present

## 2024-05-09 MED ORDER — CLOTRIMAZOLE 1 % EX CREA: 1.0000 | TOPICAL_CREAM | Freq: Two times a day (BID) | CUTANEOUS | 0 refills | Status: AC

## 2024-05-09 NOTE — Progress Notes (Signed)
 Subjective:    Patient ID: [name], female    DOB: 1965/11/25, 58 y.o.   MRN: [id number] History of Present Illness    [name] is a very pleasant 58 y.o. female patient of Dr. Cleatus with a history of hypertension, asthma, psoriasis who presents today to discuss rash.  She was initially evaluated by PCP on 04/22/2024 for increased redness and discomfort to a chronic plaque psoriasis like rash.  She was prescribed clobetasol  and asked to follow-up if no improvement.  She was evaluated in urgent care on 05/02/2024 progression of pain, swelling, and erythema to chronic rash of her left lower extremity and foot.  During her stay in the urgent care she was provided with 125 mg of Solu-Medrol  intramuscularly she was also prescribed a prednisone  taper.  She took several days of her prednisone  taper but stopped because it made her feel worse.  She called in to our office on 05/06/2024 with reports of ongoing symptoms. Her PCP prescribed Doxycycline  100 mg BID. She's taken three days of Doxycycline  and has noticed improvement, she's feeling worse.  Today her rash is more swollen, red, and more painful. She's also noticed skin peeling and new red spots within the rash.  It is painful for her to stand or walk.  She has yet to hear from dermatology regarding an appointment.  She denies tick bites, changes in medication, changes in food, travel.  No one else in the house has this rash.   Review of Systems  Constitutional:  Negative for fever.  Respiratory:  Negative for wheezing.   Musculoskeletal:  Positive for joint swelling.  Skin:  Positive for color change and rash.         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   Constipation    Dysplasia of cervix, low grade (CIN 1)    Environmental allergies    Psoriasis     Social History   Socioeconomic History   Marital status: Married    Spouse name: Not on file   Number  of children: 3   Years of education: Not on file   Highest education level: Not on file  Occupational History   Occupation: media specialist/school librarian  Tobacco Use   Smoking status: Never   Smokeless tobacco: Never  Vaping Use   Vaping status: Never Used  Substance and Sexual Activity   Alcohol use: Yes    Alcohol/week: 0.0 standard drinks of alcohol    Comment: rarely   Drug use: No   Sexual activity: Yes    Birth control/protection: Surgical  Other Topics Concern   Not on file  Social History Narrative   Not on file   Social Drivers of Health   Financial Resource Strain: Not on file  Food Insecurity: Not on file  Transportation Needs: Not on file  Physical Activity: Not on file  Stress: Not on file  Social Connections: Not on file  Intimate Partner Violence: Not on file    Past Surgical History:  Procedure Laterality Date   CESAREAN SECTION     LAPAROSCOPIC VAGINAL HYSTERECTOMY WITH SALPINGO OOPHORECTOMY Bilateral 03/18/2018   Procedure: LAPAROSCOPIC ASSISTED VAGINAL HYSTERECTOMY WITH BILATERAL SALPINGO OOPHORECTOMY;  Surgeon: Kathe Gladis LABOR, MD;  Location: [facility] ORS;  Service: Gynecology;  Laterality: Bilateral;   LAPAROSCOPY     MANDIBLE FRACTURE SURGERY     TUBAL LIGATION      Family History  Problem Relation Age  of Onset   Heart disease Maternal Grandfather    Hypertension Maternal Grandfather    Uterine cancer Mother    Colon cancer Mother 88   Esophageal cancer Father    Prostate cancer Brother    Stroke Maternal Grandmother    Thyroid  cancer Maternal Grandmother    Clotting disorder Maternal Grandmother    Heart disease Paternal Grandmother    Breast cancer Neg Hx    Ovarian cancer Neg Hx    Diabetes Neg Hx     Allergies  Allergen Reactions   Bactrim [Sulfamethoxazole-Trimethoprim] Rash   Sulfa Antibiotics Rash    Current Outpatient Medications on File Prior to Visit  Medication Sig Dispense Refill   albuterol  (VENTOLIN  HFA) 108  (90 Base) MCG/ACT inhaler INHALE 2 PUFFS BY MOUTH EVERY 6 HOURS AS NEEDED FOR WHEEZE /BREATH SHORTNESS 1 each 4   doxycycline  (VIBRA -TABS) 100 MG tablet Take 1 tablet (100 mg total) by mouth 2 (two) times daily. 14 tablet 0   clobetasol  cream (TEMOVATE ) 0.05 % Apply 1 Application topically 2 (two) times daily. (Patient not taking: Reported on 05/09/2024) 30 g 0   predniSONE  (STERAPRED UNI-PAK 21 TAB) 10 MG (21) TBPK tablet Take by mouth daily. Take 6 tabs by mouth daily  for 2 days, then 5 tabs for 2 days, then 4 tabs for 2 days, then 3 tabs for 2 days, 2 tabs for 2 days, then 1 tab by mouth daily for 2 days (Patient not taking: Reported on 05/09/2024) 42 tablet 0   No current facility-administered medications on file prior to visit.    BP 122/76   Pulse (!) 56   Temp 97.7 F (36.5 C) (Temporal)   Ht 5' 6 (1.676 m)   Wt 133 lb (60.3 kg)   LMP 02/11/2018   SpO2 98%   BMI 21.47 kg/m  Objective:   Physical Exam Constitutional:      General: She is not in acute distress. Pulmonary:     Effort: Pulmonary effort is normal.  Skin:    General: Skin is warm and dry.     Findings: Erythema and rash present.     Comments: See images below          Physical Exam        Assessment & Plan:  Rash and nonspecific skin eruption Assessment & Plan: Unclear etiology.  Based on presentation this does appear to be a psoriasis like plaque however it is not responding to treatment. Slides obtained today for skin scrape which were reviewed under the microscope.  No hyphae or evidence of fungal involvement.  Changed dermatology referral to an urgent referral. Start clotrimazole  1% cream twice daily as needed.  Labs pending today including CBC, CMP, ANA, sed rate.  Orders: -     Clotrimazole ; Apply 1 Application topically 2 (two) times daily.  Dispense: 60 g; Refill: 0 -     CBC -     Comprehensive metabolic panel with GFR -     ANA -     Sedimentation rate    Assessment and  Plan Assessment & Plan       Comer MARLA Gaskins, NP

## 2024-05-09 NOTE — Telephone Encounter (Signed)
 I called pt twice and got her VM. 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 Per DPR, we can speak to her husband, Cheryl. I called and was able to speak to him. He said to go ahead and schedule her with Mallie Gaskins at 320 and he will reach out to her. If she cannot make it to the appt, he or she will call to cancel it. But, he said she was in a lot of pain so he is really going to push for her to come. If she cannot, he understands he will have her go somewhere.

## 2024-05-09 NOTE — Assessment & Plan Note (Signed)
 Unclear etiology.  Based on presentation this does appear to be a psoriasis like plaque however it is not responding to treatment. Slides obtained today for skin scrape which were reviewed under the microscope.  No hyphae or evidence of fungal involvement.  Changed dermatology referral to an urgent referral. Start clotrimazole  1% cream twice daily as needed.  Labs pending today including CBC, CMP, ANA, sed rate.

## 2024-05-09 NOTE — Patient Instructions (Addendum)
 Apply clotrimazole  cream twice daily as needed.  Stop by the lab prior to leaving today. I will notify you of your results once received.   You should receive a phone call regarding the dermatology appointment.  Please let us  know if you have not heard back by Wednesday next week.  It was a pleasure meeting you!

## 2024-05-10 LAB — COMPREHENSIVE METABOLIC PANEL WITH GFR
AG Ratio: 1.6 (calc) (ref 1.0–2.5)
ALT: 14 U/L (ref 6–29)
AST: 12 U/L (ref 10–35)
Albumin: 4.1 g/dL (ref 3.6–5.1)
Alkaline phosphatase (APISO): 56 U/L (ref 37–153)
BUN: 16 mg/dL (ref 7–25)
CO2: 29 mmol/L (ref 20–32)
Calcium: 9.1 mg/dL (ref 8.6–10.4)
Chloride: 104 mmol/L (ref 98–110)
Creat: 0.75 mg/dL (ref 0.50–1.03)
Globulin: 2.5 g/dL (ref 1.9–3.7)
Glucose, Bld: 89 mg/dL (ref 65–99)
Potassium: 4.3 mmol/L (ref 3.5–5.3)
Sodium: 141 mmol/L (ref 135–146)
Total Bilirubin: 0.5 mg/dL (ref 0.2–1.2)
Total Protein: 6.6 g/dL (ref 6.1–8.1)
eGFR: 93 mL/min/1.73m2 (ref >=60)

## 2024-05-10 LAB — CBC
HCT: 40.9 % (ref 35.0–45.0)
Hemoglobin: 13.4 g/dL (ref 11.7–15.5)
MCH: 29.6 pg (ref 27.0–33.0)
MCHC: 32.8 g/dL (ref 32.0–36.0)
MCV: 90.3 fL (ref 80.0–100.0)
MPV: 10.7 fL (ref 7.5–12.5)
Platelets: 276 Thousand/uL (ref 140–400)
RBC: 4.53 Million/uL (ref 3.80–5.10)
RDW: 13.1 % (ref 11.0–15.0)
WBC: 8.1 Thousand/uL (ref 3.8–10.8)

## 2024-05-10 LAB — ANA: Anti Nuclear Antibody (ANA): NEGATIVE

## 2024-05-10 LAB — SEDIMENTATION RATE: Sed Rate: 2 mm/h (ref 0–30)

## 2024-05-11 ENCOUNTER — Ambulatory Visit: Payer: Self-pay | Admitting: Primary Care
